# Patient Record
Sex: Male | Born: 1940 | Hispanic: No | Marital: Married | Smoking: Never smoker
Health system: Southern US, Community
[De-identification: ages and names within clinical notes are randomized; demographics above are authoritative.]

## PROBLEM LIST (undated history)

## (undated) DIAGNOSIS — I2510 Atherosclerotic heart disease of native coronary artery without angina pectoris: Secondary | ICD-10-CM

## (undated) DIAGNOSIS — I10 Essential (primary) hypertension: Secondary | ICD-10-CM

## (undated) DIAGNOSIS — I421 Obstructive hypertrophic cardiomyopathy: Secondary | ICD-10-CM

## (undated) DIAGNOSIS — R7303 Prediabetes: Secondary | ICD-10-CM

---

## 1968-03-02 HISTORY — PX: OTHER SURGICAL HISTORY: SHX169

## 1998-12-13 ENCOUNTER — Ambulatory Visit (HOSPITAL_COMMUNITY): Admission: RE | Admit: 1998-12-13 | Discharge: 1998-12-13 | Payer: Self-pay | Admitting: Cardiology

## 1999-04-04 ENCOUNTER — Ambulatory Visit: Admission: RE | Admit: 1999-04-04 | Discharge: 1999-04-04 | Payer: Self-pay | Admitting: Pulmonary Disease

## 2011-03-06 ENCOUNTER — Encounter: Payer: Self-pay | Admitting: Cardiology

## 2011-03-10 ENCOUNTER — Encounter: Payer: Self-pay | Admitting: Cardiology

## 2011-07-28 ENCOUNTER — Other Ambulatory Visit: Payer: Self-pay

## 2011-07-28 DIAGNOSIS — I340 Nonrheumatic mitral (valve) insufficiency: Secondary | ICD-10-CM

## 2011-07-29 ENCOUNTER — Ambulatory Visit (INDEPENDENT_AMBULATORY_CARE_PROVIDER_SITE_OTHER): Payer: Medicare Other | Admitting: Cardiology

## 2011-07-29 ENCOUNTER — Encounter: Payer: Self-pay | Admitting: Cardiology

## 2011-07-29 ENCOUNTER — Other Ambulatory Visit: Payer: Self-pay

## 2011-07-29 ENCOUNTER — Ambulatory Visit (HOSPITAL_COMMUNITY): Payer: Medicare Other | Attending: Cardiology

## 2011-07-29 VITALS — BP 188/98 | HR 68 | Wt 174.0 lb

## 2011-07-29 DIAGNOSIS — I2510 Atherosclerotic heart disease of native coronary artery without angina pectoris: Secondary | ICD-10-CM | POA: Insufficient documentation

## 2011-07-29 DIAGNOSIS — I519 Heart disease, unspecified: Secondary | ICD-10-CM | POA: Insufficient documentation

## 2011-07-29 DIAGNOSIS — I10 Essential (primary) hypertension: Secondary | ICD-10-CM | POA: Insufficient documentation

## 2011-07-29 DIAGNOSIS — E7800 Pure hypercholesterolemia, unspecified: Secondary | ICD-10-CM

## 2011-07-29 DIAGNOSIS — R0989 Other specified symptoms and signs involving the circulatory and respiratory systems: Secondary | ICD-10-CM

## 2011-07-29 DIAGNOSIS — E785 Hyperlipidemia, unspecified: Secondary | ICD-10-CM | POA: Insufficient documentation

## 2011-07-29 DIAGNOSIS — R0609 Other forms of dyspnea: Secondary | ICD-10-CM

## 2011-07-29 DIAGNOSIS — I340 Nonrheumatic mitral (valve) insufficiency: Secondary | ICD-10-CM

## 2011-07-29 DIAGNOSIS — R7309 Other abnormal glucose: Secondary | ICD-10-CM

## 2011-07-29 DIAGNOSIS — I059 Rheumatic mitral valve disease, unspecified: Secondary | ICD-10-CM | POA: Insufficient documentation

## 2011-07-29 DIAGNOSIS — R7302 Impaired glucose tolerance (oral): Secondary | ICD-10-CM

## 2011-07-29 NOTE — Patient Instructions (Signed)
Your physician has requested that you have an exercise stress myoview. For further information please visit [email or web address] Please follow instruction sheet, as given.  Your physician recommends that you continue on your current medications as directed. Please refer to the Current Medication list given to you today.  Your physician has requested that you regularly monitor and record your blood pressure readings at home. Please use the same machine at the same time of day to check your readings and record them to bring to your follow-up visit.

## 2011-07-29 NOTE — Progress Notes (Signed)
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  Overall, he is now retired and doing quite well.  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 Shirlee Hess.  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  He has not had chest pain, and plays regular golf during which he walks quite a bit.  He has since I saw him acquired some mild glucose intolerance.  He also has some hypertension.  He altered his statin which he tolerated poorly, and took it again after starting Vit D.  He has mild symptoms only on pravachol.  His prior cath report is in deep archive and the report is being pulled.  Dr. Sherlyn Hess notes, that while his BP is elevated here, it normally runs around 125/ 75-85 while at home.  He is able to use the elliptical at home without symptoms.    Current Outpatient Prescriptions  Medication Sig Dispense Refill  . aspirin 162 MG EC tablet Take 162 mg by mouth daily.      Marland Kitchen Co-Enzyme Q-10 100 MG CAPS Take 100 mg by mouth 2 (two) times daily.      . metoprolol succinate (TOPROL-XL) 50 MG 24 hr tablet Take 50 mg by mouth daily. Take with or immediately following a meal.      . Multiple Vitamin (MULTIVITAMIN) tablet Take 1 tablet by mouth every other day.      . Omega-3 Fatty Acids (FISH OIL) 1000 MG CAPS Take 1,000 mg by mouth 2 (two) times daily.      . pravastatin (PRAVACHOL) 80 MG tablet Take 80 mg by mouth daily.      . Vitamin D, Ergocalciferol, (DRISDOL) 50000 UNITS CAPS Take 50,000 Units by mouth. Three times per week        Allergies  Allergen Reactions  . Ace Inhibitors Cough    No past medical history on file.  No past surgical history on file.  No family history on file.  History   Social History  . Marital Status: Married    Spouse Name: N/A    Number of Children:  N/A  . Years of Education: N/A   Occupational History  . Not on file.   Social History Main Topics  . Smoking status: Never Smoker   . Smokeless tobacco: Not on file  . Alcohol Use: Not on file  . Drug Use: Not on file  . Sexually Active: Not on file   Other Topics Concern  . Not on file   Social History Narrative  . No narrative on file    ROS: Please see the HPI.  All other systems reviewed and negative.  PHYSICAL EXAM:  BP 188/98  Pulse 68  Wt 174 lb (78.926 kg)  General: Well developed, well nourished, in no acute distress. Head:  Normocephalic and atraumatic. Neck: no JVD.  No carotid bruits.   Lungs: Clear to auscultation and percussion. Heart: Normal S1 and S2.  No murmur, rubs or gallops supine.  Left lateral--minimal apical murmur.  Standing---minimal SEM.Marland Kitchen No DM.   Abdomen:  Normal bowel sounds; soft; non tender; no organomegaly Pulses: Pulses normal in all 4 extremities. Extremities: No clubbing or cyanosis. No edema. Neurologic: Alert and oriented x 3.  EKG:  NSR.  WNL.  ECHO  Study Conclusions  - Left ventricle: The cavity size was normal. There was sigmoid-type mild septal hypertrophy. Turbulent flow across the LV outflow tract with peak measured gradient 23 mmHg. Systolic function was normal. The estimated ejection fraction was in the range of 60% to 65%. Wall motion was normal; there were no regional wall motion abnormalities. Features are consistent with a pseudonormal left ventricular filling pattern, with concomitant abnormal relaxation and increased filling pressure (grade 2 diastolic dysfunction). - Aortic valve: There was no stenosis. - Mitral valve: There was chordal SAM but no evident valvular SAM (images a bit difficult). Mild regurgitation. - Left atrium: The atrium was mildly dilated. - Right ventricle: The cavity size was normal. Systolic function was normal. - Pulmonary arteries: No complete TR doppler jet so unable to  estimate PA systolic pressure. - Inferior vena cava: The vessel was normal in size; the respirophasic diameter changes were in the normal range (= 50%); findings are consistent with normal central venous pressure. Impressions:  - Normal LV size with sigmoid-type septal hypertrophy. LV outflow tractturbulence with peak gradient reaching 23 mmHg. Normal LV systolic function, EF 60-65%. Moderate diastolic dysfunction. 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 Mild MR. Normal RV size and systolic function.    ASSESSMENT AND PLAN:

## 2011-07-30 NOTE — Assessment & Plan Note (Signed)
Most recent LDL was 81.  HDL 46.  Tolerating pravastatin while on Vit D replacement.

## 2011-07-30 NOTE — Assessment & Plan Note (Signed)
He says that fasting glucoses are 110.  Recent A1c 6.1

## 2011-07-30 NOTE — Assessment & Plan Note (Signed)
He says well controlled at home.  Had higher HR on ARB, did not tolerate ACEs in the past due to cough.  Currently on beta blocker.

## 2011-08-12 ENCOUNTER — Ambulatory Visit (HOSPITAL_COMMUNITY): Payer: Medicare Other | Attending: Cardiology | Admitting: Radiology

## 2011-08-12 ENCOUNTER — Encounter: Payer: Self-pay | Admitting: Cardiology

## 2011-08-12 VITALS — BP 141/83 | HR 68 | Ht 69.5 in | Wt 174.0 lb

## 2011-08-12 DIAGNOSIS — R0609 Other forms of dyspnea: Secondary | ICD-10-CM | POA: Insufficient documentation

## 2011-08-12 DIAGNOSIS — R0602 Shortness of breath: Secondary | ICD-10-CM

## 2011-08-12 DIAGNOSIS — R0989 Other specified symptoms and signs involving the circulatory and respiratory systems: Secondary | ICD-10-CM | POA: Insufficient documentation

## 2011-08-12 MED ORDER — TECHNETIUM TC 99M TETROFOSMIN IV KIT
11.0000 | PACK | Freq: Once | INTRAVENOUS | Status: AC | PRN
  Administered 2011-08-12: 11 via INTRAVENOUS

## 2011-08-12 MED ORDER — TECHNETIUM TC 99M TETROFOSMIN IV KIT
33.0000 | PACK | Freq: Once | INTRAVENOUS | Status: AC | PRN
  Administered 2011-08-12: 33 via INTRAVENOUS

## 2011-08-12 NOTE — Progress Notes (Signed)
[facility] SITE 3 NUCLEAR MED [address] [place] [address] [phone]  Cardiology Nuclear Med Study  Jacob Hess is a 71 y.o. male     MRN : [id number]     DOB: 09-19-1940  Procedure Date: 08/12/2011  Nuclear Med Background Indication for Stress Test:  Evaluation for Ischemia History:  '10 GXT:(+)>Cath:N/O CAD>MPS:No ischemia (all info. per patient) Cardiac Risk Factors: Family History - CAD, Hypertension, Lipids and Glucose Intolerance  Symptoms:  DOE and Palpitations   Nuclear Pre-Procedure Caffeine/Decaff Intake:  7:30am NPO After: 7:30am   Lungs:  Clear. O2 Sat: 99% on room air. IV 0.9% NS with Angio Cath:  20g  IV Site: R Antecubital  IV Started by:  Bonnita Levan, RN  Chest Size (in):  42 Cup Size: n/a  Height: 5' 9.5" (1.765 m)  Weight:  174 lb (78.926 kg)  BMI:  Body mass index is 25.33 kg/(m^2). Tech Comments:  Toprol held >24 hours    Nuclear Med Study 1 or 2 day study: 1 day  Stress Test Type:  Stress  Reading MD: Charlton Haws, MD  Order Authorizing Provider:  Shawnie Pons, MD  Resting Radionuclide: Technetium 39m Tetrofosmin  Resting Radionuclide Dose: 11.0 mCi   Stress Radionuclide:  Technetium 32m Tetrofosmin  Stress Radionuclide Dose: 33.0 mCi           Stress Protocol Rest HR: 68 Stress HR: 160  Rest BP: 141/83 Stress BP: 184/74  Exercise Time (min): 11:00 METS: 13.4   Predicted Max HR: 152 bpm % Max HR: 105.26 bpm Rate Pressure Product: 40102   Dose of Adenosine (mg):  n/a Dose of Lexiscan: n/a mg  Dose of Atropine (mg): n/a Dose of Dobutamine: n/a mcg/kg/min (at max HR)  Stress Test Technologist: Smiley Houseman, CMA-N  Nuclear Technologist:  Domenic Polite, CNMT     Rest Procedure:  Myocardial perfusion imaging was performed at rest 45 minutes following the intravenous administration of Technetium 49m Tetrofosmin.  Rest ECG: No acute changes  Stress Procedure:  The patient exercised on the treadmill utilizing the  Bruce protocol for eleven minutes.  He then stopped due to fatigue and denied any chest pain.  There were marked ST-T wave changes, that quickly resolved in recovery; occasional PVC's and PAC's were noted.  Technetium 40m Tetrofosmin was injected at peak exercise and myocardial perfusion imaging was performed after a brief delay.  Stress ECG: 1.5 mm ST segment depression in lateral leads  QPS Raw Data Images:  Normal; no motion artifact; normal heart/lung ratio. Stress Images:  Normal homogeneous uptake in all areas of the myocardium. Rest Images:  Normal homogeneous uptake in all areas of the myocardium. Subtraction (SDS):  Normal Transient Ischemic Dilatation (Normal <1.22):  0.84 Lung/Heart Ratio (Normal <0.45):  0.28  Quantitative Gated Spect Images QGS EDV:  61 ml QGS ESV:  16 ml  Impression Exercise Capacity:  Excellent exercise capacity. BP Response:  Normal blood pressure response. Clinical Symptoms:  There is dyspnea. ECG Impression:  1.5 mm ST segment depression in lateral leads Comparison with Prior Nuclear Study: No images to compare  Overall Impression:  Low risk study.  Normal nuclear images. Positive ECG.  Patient has apparantly had a positive ECG in past followed by normal cath in 2010  LV Ejection Fraction: 74%.  LV Wall Motion:  NL LV Function; NL Wall Motion     Charlton Haws

## 2011-08-19 ENCOUNTER — Telehealth: Payer: Self-pay

## 2011-08-19 MED ORDER — IRBESARTAN 150 MG PO TABS: 150.0000 mg | ORAL_TABLET | Freq: Every day | ORAL | Status: DC

## 2011-08-19 NOTE — Telephone Encounter (Signed)
Per communication from the pt and Dr Riley Kill the pt's medications will be changed.  The pt will start Irbesartan 150mg  every morning and decrease Metoprolol ER to 25mg  at bedtime. Rx sent to pharmacy.

## 2013-02-03 ENCOUNTER — Ambulatory Visit (INDEPENDENT_AMBULATORY_CARE_PROVIDER_SITE_OTHER): Payer: Medicare Other | Admitting: Cardiovascular Disease

## 2013-02-03 VITALS — BP 165/88 | HR 63 | Ht 69.5 in | Wt 172.8 lb

## 2013-02-03 DIAGNOSIS — I10 Essential (primary) hypertension: Secondary | ICD-10-CM

## 2013-02-03 DIAGNOSIS — I2510 Atherosclerotic heart disease of native coronary artery without angina pectoris: Secondary | ICD-10-CM

## 2013-02-03 DIAGNOSIS — E7800 Pure hypercholesterolemia, unspecified: Secondary | ICD-10-CM

## 2013-02-03 NOTE — Patient Instructions (Signed)
Your physician has requested that you have an exercise tolerance test with Dr Excell Seltzer. For further information please visit [email or web address] Please also follow instruction sheet, as given.  Your physician has requested that you have an abdominal aorta duplex. During this test, an ultrasound is used to evaluate the aorta. Allow 30 minutes for this exam. Do not eat after midnight the day before and avoid carbonated beverages  Your physician has requested that you have an echocardiogram. Echocardiography is a painless test that uses sound waves to create images of your heart. It provides your doctor with information about the size and shape of your heart and how well your heart's chambers and valves are working. This procedure takes approximately one hour. There are no restrictions for this procedure.  Your physician recommends that you return for a FASTING LIPID and LIVER profile--nothing to eat or drink after midnight  Your physician recommends that you continue on your current medications as directed. Please refer to the Current Medication list given to you today.

## 2013-02-03 NOTE — Progress Notes (Signed)
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 He has been seen by Dr Riley Kill in the past. He is followed for hypertension, hyperlipidemia, CAD, and mild mitral regurgitation. 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 His last echocardiogram was May 2013. He underwent cardiac catheterization in 2000 by Dr Riley Kill and this demonstrated nonobstructive CAD.  He remains physically active with regular exercise. He experiences exertional dyspnea with moderate level exercise (Class 2) without much progression over the past year. He denies exertional chest pain or pressure. He denies edema, palpitations, orthopnea, PND, or syncope.   He tried an ARB because of HTN and prediabetes but was unable to tolerate because of lightheadedness with exercise. He notes a consistent BP drop during exercise, but he has not been lightheaded since stopping the ARB.  Outpatient Encounter Prescriptions as of 02/03/2013  Medication Sig  . aspirin 162 MG EC tablet Take 162 mg by mouth daily.  . metoprolol succinate (TOPROL-XL) 25 MG 24 hr tablet Take 25 mg by mouth at bedtime. Take with or immediately following a meal.  25-50 Mg depending on BP  . Omega-3 Fatty Acids (FISH OIL) 1000 MG CAPS Take 1,000 mg by mouth daily.   . pravastatin (PRAVACHOL) 80 MG tablet Take 80 mg by mouth daily.  . Vitamin D, Ergocalciferol, (DRISDOL) 50000 UNITS CAPS Take 50,000 Units by mouth. Three times per week  . [DISCONTINUED] Co-Enzyme Q-10 100 MG CAPS Take 100 mg by mouth 2 (two) times daily.  . [DISCONTINUED] irbesartan (AVAPRO) 150 MG tablet Take 1 tablet (150 mg total) by mouth daily.  . [DISCONTINUED] Multiple Vitamin (MULTIVITAMIN) tablet Take 1 tablet by mouth every other day.    Allergies  Allergen Reactions  . Ace Inhibitors Cough    No past medical history on file.  ROS: Negative except as per HPI  BP 165/88  Pulse 63  Ht 5'  9.5" (1.765 m)  Wt 172 lb 12.8 oz (78.382 kg)  BMI 25.16 kg/m2  PHYSICAL EXAM: Pt is alert and oriented, NAD HEENT: normal Neck: JVP - normal, carotids 2+= without bruits Lungs: CTA bilaterally CV: RRR without murmur or gallop, even with positional maneuvers Abd: soft, NT, Positive BS, no hepatomegaly Ext: no C/C/E, distal pulses intact and equal Skin: warm/dry no rash  EKG:  Normal sinus rhythm 64 beats per minute, within normal limits  2-D echocardiogram: Left ventricle: The cavity size was normal. There was sigmoid-type mild septal hypertrophy. Turbulent flow across the LV outflow tract with peak measured gradient 23 mmHg. Systolic function was normal. The estimated ejection fraction was in the range of 60% to 65%. Wall motion was normal; there were no regional wall motion abnormalities. Features are consistent with a pseudonormal left ventricular filling pattern, with concomitant abnormal relaxation and increased filling pressure (grade 2 diastolic dysfunction).  ------------------------------------------------------------ Aortic valve: Trileaflet. Doppler: There was no stenosis. No regurgitation.  ------------------------------------------------------------ Aorta: Aortic root: The aortic root was normal in size. Ascending aorta: The ascending aorta was normal in size.  ------------------------------------------------------------ Mitral valve: There was chordal SAM but no evident valvular SAM (images a bit difficult). Doppler: There was no evidence for stenosis. Mild regurgitation. Peak gradient: 3mm Hg (D).  ------------------------------------------------------------ Left atrium: The atrium was mildly dilated.  ------------------------------------------------------------ Right ventricle: The cavity size was normal. Systolic function was normal.  ------------------------------------------------------------ Pulmonic valve: Structurally normal valve.  Cusp separation was normal. Doppler: Transvalvular velocity  was within the normal range. Trivial regurgitation.  ------------------------------------------------------------ Tricuspid valve: Doppler: Trivial regurgitation.  ------------------------------------------------------------ Pulmonary artery: No complete TR doppler jet so unable to estimate PA systolic pressure.  ------------------------------------------------------------ Right atrium: The atrium was normal in size.  ------------------------------------------------------------ Pericardium: There was no pericardial effusion.  ------------------------------------------------------------ Systemic veins: Inferior vena cava: The vessel was normal in size; the respirophasic diameter changes were in the normal range (= 50%); findings are consistent with normal central venous Pressure.  Myoview scan 08/12/2011: QPS  Raw Data Images: Normal; no motion artifact; normal heart/lung ratio.  Stress Images: Normal homogeneous uptake in all areas of the myocardium.  Rest Images: Normal homogeneous uptake in all areas of the myocardium.  Subtraction (SDS): Normal  Transient Ischemic Dilatation (Normal <1.22): 0.84  Lung/Heart Ratio (Normal <0.45): 0.28  Quantitative Gated Spect Images  QGS EDV: 61 ml  QGS ESV: 16 ml  Impression  Exercise Capacity: Excellent exercise capacity.  BP Response: Normal blood pressure response.  Clinical Symptoms: There is dyspnea.  ECG Impression: 1.5 mm ST segment depression in lateral leads  Comparison with Prior Nuclear Study: No images to compare  Overall Impression: Low risk study. Normal nuclear images. Positive ECG. Patient has apparantly had a positive ECG in past followed by normal cath in 2010  LV Ejection Fraction: 74%. LV Wall Motion: NL LV Function; NL Wall Motion   ASSESSMENT AND PLAN: 1. CAD, native vessel. Dyspnea with exertion noted. He had nonobstructive disease at cath 15 years ago.  Will check an exercise EKG stress test. Continue current med Rx.  2. LV outflow obstruction - may be responsible for exercise-induced symptoms. Will evaluate with a repeat 2D echo and exercise treadmill.  3. AAA screen - 72 year-old male with known CAD. Check abdominal aortic ultrasound.  4. Hyperlipidemia - tolerating a statin drug. Repeat lipids when he returns for other testing. Previous lipid studies reviewed.  Tonny Bollman 02/04/2013 6:55 AM

## 2013-02-04 ENCOUNTER — Encounter: Payer: Self-pay | Admitting: Cardiovascular Disease

## 2013-02-04 DIAGNOSIS — I2510 Atherosclerotic heart disease of native coronary artery without angina pectoris: Secondary | ICD-10-CM | POA: Insufficient documentation

## 2013-03-17 ENCOUNTER — Other Ambulatory Visit: Payer: Medicare Other

## 2013-03-17 ENCOUNTER — Encounter: Payer: Medicare Other | Admitting: Physician Assistant

## 2013-03-17 ENCOUNTER — Other Ambulatory Visit (HOSPITAL_COMMUNITY): Payer: Medicare Other

## 2013-03-17 ENCOUNTER — Encounter (HOSPITAL_COMMUNITY): Payer: Medicare Other

## 2013-03-20 ENCOUNTER — Telehealth: Payer: Self-pay | Admitting: Cardiovascular Disease

## 2013-03-20 NOTE — Telephone Encounter (Signed)
New message   Patient did not want to disclose any information will disclose when the nurse call back .

## 2013-03-20 NOTE — Telephone Encounter (Signed)
Pt wants to speak with Dr. Excell Seltzerooper directly no dentalis given. Pt  is aware that Dr. Excell Seltzerooper is not in the office today. Pt states will call back  on Wednesday when MD  is in.

## 2013-03-20 NOTE — Telephone Encounter (Signed)
Will forward to Dr Excell Seltzerooper so he will be aware

## 2013-03-31 ENCOUNTER — Other Ambulatory Visit: Payer: Self-pay | Admitting: Nurse Practitioner

## 2013-03-31 ENCOUNTER — Ambulatory Visit (HOSPITAL_BASED_OUTPATIENT_CLINIC_OR_DEPARTMENT_OTHER): Payer: Medicare Other | Admitting: Cardiology

## 2013-03-31 ENCOUNTER — Ambulatory Visit (HOSPITAL_COMMUNITY): Payer: Medicare Other | Attending: Cardiovascular Disease

## 2013-03-31 ENCOUNTER — Other Ambulatory Visit: Payer: Medicare Other

## 2013-03-31 ENCOUNTER — Ambulatory Visit (INDEPENDENT_AMBULATORY_CARE_PROVIDER_SITE_OTHER): Payer: Medicare Other | Admitting: Cardiovascular Disease

## 2013-03-31 DIAGNOSIS — I2510 Atherosclerotic heart disease of native coronary artery without angina pectoris: Secondary | ICD-10-CM | POA: Insufficient documentation

## 2013-03-31 DIAGNOSIS — E7800 Pure hypercholesterolemia, unspecified: Secondary | ICD-10-CM

## 2013-03-31 DIAGNOSIS — R0602 Shortness of breath: Secondary | ICD-10-CM

## 2013-03-31 DIAGNOSIS — E785 Hyperlipidemia, unspecified: Secondary | ICD-10-CM | POA: Insufficient documentation

## 2013-03-31 DIAGNOSIS — I10 Essential (primary) hypertension: Secondary | ICD-10-CM

## 2013-03-31 DIAGNOSIS — R0989 Other specified symptoms and signs involving the circulatory and respiratory systems: Secondary | ICD-10-CM

## 2013-03-31 DIAGNOSIS — I700 Atherosclerosis of aorta: Secondary | ICD-10-CM

## 2013-03-31 DIAGNOSIS — R9439 Abnormal result of other cardiovascular function study: Secondary | ICD-10-CM

## 2013-03-31 DIAGNOSIS — R0609 Other forms of dyspnea: Secondary | ICD-10-CM

## 2013-03-31 NOTE — Progress Notes (Signed)
Exercise Treadmill Test  Pre-Exercise Testing Evaluation Rhythm: normal sinus  Rate: 86     Test  Exercise Tolerance Test Ordering MD: Tonny BollmanMichael Cooper, MD  Interpreting MD: Tonny BollmanMichael Cooper, MD  Unique Test No: 1  Treadmill:  1  Indication for ETT: DOE  Contraindication to ETT: No   Stress Modality: exercise - treadmill  Cardiac Imaging Performed: non   Protocol: standard Bruce - maximal  Max BP:  162/67  Max MPHR (bpm):  150 85% MPR (bpm): 128  MPHR obtained (bpm):  157 % MPHR obtained:  105  Reached 85% MPHR (min:sec):  3:52 Total Exercise Time (min-sec):  10:00  Workload in METS:  11.7 Borg Scale: 13  Reason ETT Terminated:  dyspnea    ST Segment Analysis At Rest: normal ST segments - no evidence of significant ST depression With Exercise: significant ischemic ST depression  Other Information Arrhythmia:  No Angina during ETT:  absent (0) Quality of ETT:  diagnostic  ETT Interpretation:  abnormal - evidence of ST depression consistent with ischemia  Comments: 1. Good exercise tolerance 2. Clinically positive EKG with 2.5 mm flat ST depression at peak exercise associated with drop in blood pressure 3. No angina with exertion  Recommendations: Gated Cardiac CTA to eval for obstructive CAD.

## 2013-03-31 NOTE — Progress Notes (Signed)
Echo performed. 

## 2013-04-04 ENCOUNTER — Other Ambulatory Visit: Payer: Self-pay | Admitting: *Deleted

## 2013-04-04 ENCOUNTER — Encounter: Payer: Self-pay | Admitting: Cardiovascular Disease

## 2013-04-04 DIAGNOSIS — R9439 Abnormal result of other cardiovascular function study: Secondary | ICD-10-CM

## 2013-04-04 DIAGNOSIS — R0602 Shortness of breath: Secondary | ICD-10-CM

## 2013-04-04 NOTE — Progress Notes (Signed)
Pt will need bmet for ct angio/ order placed.

## 2013-04-14 ENCOUNTER — Ambulatory Visit (HOSPITAL_COMMUNITY)
Admission: RE | Admit: 2013-04-14 | Discharge: 2013-04-14 | Disposition: A | Discharge status: Home / Self Care | Payer: Medicare Other | Source: Ambulatory Visit | Attending: Cardiovascular Disease | Admitting: Cardiovascular Disease

## 2013-04-14 ENCOUNTER — Encounter (HOSPITAL_COMMUNITY): Payer: Self-pay

## 2013-04-14 DIAGNOSIS — R0602 Shortness of breath: Secondary | ICD-10-CM

## 2013-04-14 DIAGNOSIS — I2510 Atherosclerotic heart disease of native coronary artery without angina pectoris: Secondary | ICD-10-CM | POA: Insufficient documentation

## 2013-04-14 DIAGNOSIS — R9439 Abnormal result of other cardiovascular function study: Secondary | ICD-10-CM

## 2013-04-14 DIAGNOSIS — R079 Chest pain, unspecified: Secondary | ICD-10-CM

## 2013-04-14 HISTORY — DX: Essential (primary) hypertension: I10

## 2013-04-14 MED ORDER — METOPROLOL TARTRATE 1 MG/ML IV SOLN
INTRAVENOUS | Status: AC
  Filled 2013-04-14: qty 5

## 2013-04-14 MED ORDER — METOPROLOL TARTRATE 1 MG/ML IV SOLN
INTRAVENOUS | Status: AC
  Administered 2013-04-14: 14:00:00 5 mg via INTRAVENOUS
  Filled 2013-04-14: qty 5

## 2013-04-14 MED ORDER — IOHEXOL 350 MG/ML SOLN
80.0000 mL | Freq: Once | INTRAVENOUS | Status: AC | PRN
  Administered 2013-04-14: 80 mL via INTRAVENOUS

## 2013-04-14 MED ORDER — METOPROLOL TARTRATE 1 MG/ML IV SOLN
5.0000 mg | INTRAVENOUS | Status: DC | PRN
  Administered 2013-04-14: 5 mg via INTRAVENOUS
  Filled 2013-04-14: qty 5

## 2013-04-14 MED ORDER — NITROGLYCERIN 0.4 MG SL SUBL
SUBLINGUAL_TABLET | SUBLINGUAL | Status: DC
Start: 2013-04-14 — End: 2013-04-15
  Filled 2013-04-14: qty 25

## 2013-04-19 ENCOUNTER — Telehealth: Payer: Self-pay | Admitting: Nurse Practitioner

## 2013-04-19 DIAGNOSIS — I421 Obstructive hypertrophic cardiomyopathy: Secondary | ICD-10-CM

## 2013-04-19 NOTE — Telephone Encounter (Signed)
Order for Cardiac MRI in epic and [facility] Dr. Shirlee LatchMcLean is available to read.

## 2013-04-19 NOTE — Telephone Encounter (Signed)
Message copied by Levi AlandSWINYER, MICHELLE M on Wed Apr 19, 2013 11:51 AM ------      Message from: Tonny BollmanOOPER, MICHAEL      Created: Fri Apr 14, 2013  3:07 PM       Reviewed study with Dr Shirlee LatchMcLean. Discussed with patient. Some findings indicative of hypertrophic cardiomyopathy. Recommend cardiac MRI to evaluate for hypertrophic cardiomyopathy. ------

## 2013-04-28 ENCOUNTER — Encounter: Payer: Self-pay | Admitting: Cardiovascular Disease

## 2013-05-04 ENCOUNTER — Ambulatory Visit (HOSPITAL_COMMUNITY)
Admission: RE | Admit: 2013-05-04 | Discharge: 2013-05-04 | Disposition: A | Discharge status: Home / Self Care | Payer: Medicare Other | Source: Ambulatory Visit | Attending: Cardiovascular Disease | Admitting: Cardiovascular Disease

## 2013-05-04 DIAGNOSIS — I421 Obstructive hypertrophic cardiomyopathy: Secondary | ICD-10-CM

## 2013-05-04 DIAGNOSIS — I517 Cardiomegaly: Secondary | ICD-10-CM | POA: Insufficient documentation

## 2013-05-04 LAB — CREATININE, SERUM
CREATININE: 1.15 mg/dL (ref 0.50–1.35)
GFR calc Af Amer: 73 mL/min — ABNORMAL LOW (ref >90)
GFR, EST NON AFRICAN AMERICAN: 63 mL/min — AB (ref >90)

## 2013-05-04 MED ORDER — GADOBENATE DIMEGLUMINE 529 MG/ML IV SOLN
27.0000 mL | Freq: Once | INTRAVENOUS | Status: AC | PRN
  Administered 2013-05-04: 27 mL via INTRAVENOUS

## 2015-01-04 ENCOUNTER — Encounter: Payer: Self-pay | Admitting: Cardiovascular Disease

## 2015-01-04 ENCOUNTER — Ambulatory Visit (INDEPENDENT_AMBULATORY_CARE_PROVIDER_SITE_OTHER): Payer: Medicare Other | Admitting: Cardiovascular Disease

## 2015-01-04 VITALS — BP 137/70 | HR 59 | Ht 69.5 in | Wt 170.0 lb

## 2015-01-04 DIAGNOSIS — I422 Other hypertrophic cardiomyopathy: Secondary | ICD-10-CM

## 2015-01-04 NOTE — Progress Notes (Signed)
Cardiology Office Note Date:  01/06/2015   ID:  Jacob Hess, DOB Oct 26, 1940, MRN [id number]  PCP:  Feliciana RossettiGRISSO,GREG, MD  Cardiologist:  Tonny Bollmanooper, Ira Dougher, MD    Chief Complaint  Patient presents with  . Follow-up   History of Present Illness: Jacob Hess is a 74 y.o. male who presents for follow-up evaluation. Since last seen, Dr Sherlyn Lickhatt is doing well. He is followed for hypertension, hyperlipidemia, CAD, and mild mitral regurgitation. 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  He remains active with regular exercise and follows a Mediterranean Diet. He notes BP in the morning is usually in the 140-150's, but drops to 100 mmHg or less following exercise. He is generally asymptomatic with this. No chest pain, shortness of breath, or syncope. Rare palpitations. No edema or other complaints.  Past Medical History  Diagnosis Date  . Hypertension     History reviewed. No pertinent past surgical history.  Current Outpatient Prescriptions  Medication Sig Dispense Refill  . aspirin EC 81 MG tablet Take 81 mg by mouth daily.    . colesevelam (WELCHOL) 625 MG tablet Take 625 mg by mouth 3 (three) times daily with meals.    . FLULAVAL QUADRIVALENT injection See admin instructions.  0  . irbesartan (AVAPRO) 75 MG tablet Take 75 mg by mouth daily.  3  . metoprolol succinate (TOPROL-XL) 100 MG 24 hr tablet TAKE 1.5 TABLETS BY MOUTH DAILY  3  . Omega-3 Fatty Acids (OMEGA-3 FISH OIL) 1200 MG CAPS Take 1 capsule by mouth 2 (two) times daily.    . pravastatin (PRAVACHOL) 80 MG tablet Take 80 mg by mouth daily.    . Vitamin D, Ergocalciferol, (DRISDOL) 50000 UNITS CAPS Take 50,000 Units by mouth. Three times per week     No current facility-administered medications for this visit.    Allergies:   Ace inhibitors   Social History:  The patient  reports that he has never smoked. He does not have any smokeless  tobacco history on file.   Family History:  The patient's  family history includes Heart attack in his brother; Hypertension in his brother and sister; Stroke in his maternal grandmother.    ROS:  Please see the history of present illness.  Otherwise, review of systems is positive for palpitations.  All other systems are reviewed and negative.    PHYSICAL EXAM: VS:  BP 137/70 mmHg  Pulse 59  Ht 5' 9.5" (1.765 m)  Wt 170 lb (77.111 kg)  BMI 24.75 kg/m2 , BMI Body mass index is 24.75 kg/(m^2). GEN: Well nourished, well developed, in no acute distress HEENT: normal Neck: no JVD, no masses. No carotid bruits Cardiac: RRR with soft systolic murmur at the LSB              Respiratory:  clear to auscultation bilaterally, normal work of breathing GI: soft, nontender, nondistended, + BS MS: no deformity or atrophy Ext: no pretibial edema, pedal pulses 2+= bilaterally Skin: warm and dry, no rash Neuro:  Strength and sensation are intact Psych: euthymic mood, full affect  EKG:  EKG is ordered today. The ekg ordered today shows sinus rhythm 59 bpm, within normal limits  Recent Labs: No results found for requested labs within last 365 days.   Lipid Panel  No results found for: CHOL, TRIG, HDL, CHOLHDL, VLDL, LDLCALC, LDLDIRECT    Wt Readings from Last 3 Encounters:  01/04/15 170 lb (77.111 kg)  02/03/13 172 lb 12.8 oz (78.382 kg)  08/12/11 174 lb (78.926 kg)     Cardiac Studies Reviewed: 2D Echo 03/31/2013: Left ventricle: LV systolic function is vigorours with near cavity obliteration. There is systolic anterior motion of the mitral leaflet. Peak gradient through the LVOT/AV at rest is 32 mm Hg respectively. The cavity size was normal. Wall thickness was increased in a pattern of moderate LVH. Systolic function was vigorous. The estimated ejection fraction was in the range of 65% to 70%. Doppler parameters are consistent with abnormal left ventricular relaxation (grade 1  diastolic dysfunction).  ------------------------------------------------------------ Aortic valve:  Structurally normal valve.  Cusp separation was normal. Doppler: Transvalvular velocity was within the normal range. There was no stenosis. No regurgitation.  ------------------------------------------------------------ Aorta: The aorta was normal, not dilated, and non-diseased.  ------------------------------------------------------------ Mitral valve:  Structurally normal valve.  Leaflet separation was normal. Doppler: Transvalvular velocity was within the normal range. There was no evidence for stenosis. Trivial regurgitation.  ------------------------------------------------------------ Left atrium: The atrium was normal in size.  ------------------------------------------------------------ Right ventricle: The cavity size was normal. Wall thickness was normal. Systolic function was normal.  ------------------------------------------------------------ Pulmonic valve:  Structurally normal valve.  Cusp separation was normal. Doppler: Transvalvular velocity was within the normal range. No regurgitation.  ------------------------------------------------------------ Tricuspid valve:  Structurally normal valve.  Leaflet separation was normal. Doppler: Transvalvular velocity was within the normal range. No regurgitation.  ------------------------------------------------------------ Right atrium: The atrium was normal in size.  ------------------------------------------------------------ Pericardium: There was no pericardial effusion.  ------------------------------------------------------------ Systemic veins: Inferior vena cava: The vessel was normal in size; the respirophasic diameter changes were in the normal range (= 50%); findings are consistent with normal central  venous pressure.  ------------------------------------------------------------ Post procedure conclusions Ascending Aorta:  - The aorta was normal, not dilated, and non-diseased.  ------------------------------------------------------------  2D measurements    Normal Doppler measurements  Normal Left ventricle         Left ventricle LVID ED, 39.88 mm   43-52  Ea, lat   8.58 cm/s ------ chord,             ann, tiss PLAX              DP LVID ES,  23.1 mm   23-38  E/Ea, lat  7.65    ------ chord,             ann, tiss PLAX              DP FS,     42 %   >29   Ea, med   4.58 cm/s ------ chord,             ann, tiss PLAX              DP LVPW, ED 10.09 mm   ------ E/Ea, med 14.32    ------ IVS/LVPW  1.65    <1.3  ann, tiss ratio, ED           DP Ventricular septum       LVOT IVS, ED  16.67 mm   ------ Peak vel,  216 cm/s ------ LVOT              S Diam, S   21 mm   ------ VTI, S   41.1 cm  ------ Area    3.46 cm^2  ------ Peak     19 mm Hg ------ Diam     21 mm   ------ gradient,  Aorta             S Root     35 mm   ------ Stroke vol 142.4 ml  ------ diam, ED            Stroke   73.4 ml/m^ ------ Left atrium          index      2 AP dim    33 mm   ------ Mitral valve AP dim   1.7 cm/m^2 <2.2  Peak E vel 65.6 cm/s ------ index             Peak A vel  78 cm/s ------                Decelerati  310 ms  150-23                on time        0                Peak E/A   0.8    ------                ratio  Cardiac CT: FINDINGS: Non-cardiac: See separate report from [facility]. No significant findings on limited lung and  soft tissue windows.  Calcium Score: 322 Agatston units  Coronary Arteries: Right dominant with no anomalies  LM: No plaque or stenosis.  LAD system: Calcified plaque noted in the proximal LAD with mild stenosis. There was a small D1 and a moderate D2 without significant disease.  Circumflex system: Calcified plaque in the proximal LCx with minimal stenosis. There was a small ramus, a small OM1, and a moderate PLOM without significant disease.  RCA: There was calcified plaque in the proximal and in the mid RCA with minimal stenosis.  IMPRESSION: 1. Nonobstructive coronary disease as described above.  2. Coronary artery calcium score 322 Agatston units, placing the patient in the 61st percentile for his age and gender. This suggests intermediate risk of future cardiac events.  Cardiac MRI: MEASUREMENTS: Septal wall: 15 mm  Posterior wall: 7 mm  LV EDV: 99 mL  LV SV 69 mL  LV EF 70%  FINDINGS: Limited views of the lung fields showed no gross abnormalities. Normal left ventricular size with vigorous systolic function, EF 70%. No wall motion abnormalities. There was mild asymmetric basal septal hypertrophy. There was mitral valve systolic anterior motion with turbulent flow across the LV outflow tract. Normal right ventricular size and systolic function. Normal atrial sizes. The aortic valve was trileaflet with no stenosis or regurgitation. As noted, there was systolic anterior motion of the mitral valve with no more than mild mitral regurgitation (though flow sequences to quantify were not done).  First pass perfusion images appeared normal. There was no myocardial delayed enhancement.  IMPRESSION: 1. Normal LV size with vigorous systolic function, EF 70%.  2. Mild asymmetric basal septal hypertrophy with turbulence across the LV outflow tract suggesting elevated gradient (echo needed to quantify gradient). There was mild valve systolic  anterior motion with no more than mild MR.  3. Normal RV size and systolic function.  4. No myocardial delayed enhancement, so no definitive evidence for prior myocardial infarction, infiltrative disease, or myocarditis.  5. 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 There is no delayed enhancement. It is possible that he could have HOCM physiology in the setting of significant hypertension with basal septal hypertrophy (in the absence of genetically mediated HOCM).  ASSESSMENT AND  PLAN: 1.  CAD, mild, nonobstructive. Cardiac CTA from last year reviewed. Continue risk reduction measures. LDL 59 on outside labs.   2. Hyperlipidemia: on combination of Welchol, pravastatin, Omega-3. Can't tolerate high-intensity statins.  3. 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 Septum 1.5-1.65 cm depending on which imaging modality (echo/ CMR). Will refer to Dr Regino Schultze for further considerations such as genetic testing. Overall appears stable without high-risk features. Will repeat an echo and exercise treadmill.  Current medicines are reviewed with the patient today.  The patient does not have concerns regarding medicines.  Labs/ tests ordered today include:   Orders Placed This Encounter  Procedures  . Exercise Tolerance Test  . EKG 12-Lead  . Echocardiogram    Disposition:   FU one year  Signed, Tonny Bollman, MD  01/06/2015 10:19 PM    [facility] [address] [place], [place], [place]  [address] Phone: [phone]; Fax: [phone]

## 2015-01-04 NOTE — Patient Instructions (Signed)
Medication Instructions:  Your physician recommends that you continue on your current medications as directed. Please refer to the Current Medication list given to you today.  Labwork: No new orders.   Testing/Procedures: Your physician has requested that you have an exercise tolerance test with Dr Excell Seltzerooper. For further information please visit [email or web address] Please also follow instruction sheet, as given.  Your physician has requested that you have an echocardiogram. Echocardiography is a painless test that uses sound waves to create images of your heart. It provides your doctor with information about the size and shape of your heart and how well your heart's chambers and valves are working. This procedure takes approximately one hour. There are no restrictions for this procedure.  Follow-Up: You have been referred to Dr Nolon RodAndrew Wang at [facility].    Any Other Special Instructions Will Be Listed Below (If Applicable).     If you need a refill on your cardiac medications before your next appointment, please call your pharmacy.

## 2015-01-06 ENCOUNTER — Encounter: Payer: Self-pay | Admitting: Cardiovascular Disease

## 2015-01-15 ENCOUNTER — Other Ambulatory Visit: Payer: Self-pay

## 2015-01-15 ENCOUNTER — Ambulatory Visit (INDEPENDENT_AMBULATORY_CARE_PROVIDER_SITE_OTHER): Payer: Medicare Other

## 2015-01-15 ENCOUNTER — Encounter: Payer: Medicare Other | Admitting: Cardiovascular Disease

## 2015-01-15 ENCOUNTER — Other Ambulatory Visit (HOSPITAL_COMMUNITY): Payer: Medicare Other

## 2015-01-15 ENCOUNTER — Ambulatory Visit (HOSPITAL_COMMUNITY): Payer: Medicare Other | Attending: Cardiovascular Disease

## 2015-01-15 DIAGNOSIS — I10 Essential (primary) hypertension: Secondary | ICD-10-CM | POA: Insufficient documentation

## 2015-01-15 DIAGNOSIS — I5189 Other ill-defined heart diseases: Secondary | ICD-10-CM | POA: Diagnosis not present

## 2015-01-15 DIAGNOSIS — I517 Cardiomegaly: Secondary | ICD-10-CM | POA: Insufficient documentation

## 2015-01-15 DIAGNOSIS — I340 Nonrheumatic mitral (valve) insufficiency: Secondary | ICD-10-CM | POA: Diagnosis not present

## 2015-01-15 DIAGNOSIS — E785 Hyperlipidemia, unspecified: Secondary | ICD-10-CM | POA: Insufficient documentation

## 2015-01-15 DIAGNOSIS — I422 Other hypertrophic cardiomyopathy: Secondary | ICD-10-CM

## 2015-01-15 DIAGNOSIS — I421 Obstructive hypertrophic cardiomyopathy: Secondary | ICD-10-CM | POA: Insufficient documentation

## 2015-01-15 DIAGNOSIS — I351 Nonrheumatic aortic (valve) insufficiency: Secondary | ICD-10-CM | POA: Insufficient documentation

## 2015-01-15 LAB — EXERCISE TOLERANCE TEST
CSEPPHR: 127 {beats}/min
Estimated workload: 11.7 METS
Exercise duration (min): 10 min
Exercise duration (sec): 0 s
MPHR: 148 {beats}/min
Percent HR: 86 %
RPE: 15
Rest HR: 57 {beats}/min

## 2015-01-15 NOTE — Patient Instructions (Signed)
Your physician wants you to follow-up in: 1 YEAR with Dr Cooper.  You will receive a reminder letter in the mail two months in advance. If you don't receive a letter, please call our office to schedule the follow-up appointment.  

## 2015-01-17 ENCOUNTER — Encounter: Payer: Medicare Other | Admitting: Physician Assistant

## 2015-01-17 ENCOUNTER — Other Ambulatory Visit (HOSPITAL_COMMUNITY): Payer: Medicare Other

## 2015-01-23 ENCOUNTER — Encounter: Payer: Self-pay | Admitting: Physician Assistant

## 2016-01-10 ENCOUNTER — Encounter: Payer: Self-pay | Admitting: Cardiovascular Disease

## 2016-01-10 ENCOUNTER — Ambulatory Visit (INDEPENDENT_AMBULATORY_CARE_PROVIDER_SITE_OTHER): Payer: Medicare Other | Admitting: Cardiovascular Disease

## 2016-01-10 VITALS — BP 124/72 | HR 64 | Ht 69.5 in | Wt 177.0 lb

## 2016-01-10 DIAGNOSIS — I421 Obstructive hypertrophic cardiomyopathy: Secondary | ICD-10-CM | POA: Diagnosis not present

## 2016-01-10 DIAGNOSIS — R002 Palpitations: Secondary | ICD-10-CM | POA: Diagnosis not present

## 2016-01-10 DIAGNOSIS — I2510 Atherosclerotic heart disease of native coronary artery without angina pectoris: Secondary | ICD-10-CM

## 2016-01-10 NOTE — Patient Instructions (Signed)
Medication Instructions:  Your physician recommends that you continue on your current medications as directed. Please refer to the Current Medication list given to you today.   Labwork: None Ordered   Testing/Procedures: Your physician has recommended that you wear a holter monitor. Holter monitors are medical devices that record the heart's electrical activity. Doctors most often use these monitors to diagnose arrhythmias. Arrhythmias are problems with the speed or rhythm of the heartbeat. The monitor is a small, portable device. You can wear one while you do your normal daily activities. This is usually used to diagnose what is causing palpitations/syncope (passing out).  Your physician has requested that you have an exercise tolerance test in 1 year before your office visit. For further information please visit [email or web address] Please also follow instruction sheet, as given.  Your physician has requested that you have an echocardiogram in 1 year before your office visit. Echocardiography is a painless test that uses sound waves to create images of your heart. It provides your doctor with information about the size and shape of your heart and how well your heart's chambers and valves are working. This procedure takes approximately one hour. There are no restrictions for this procedure.    Follow-Up: Your physician wants you to follow-up in: 1 year with Dr. Theodoro Parmaooper You will receive a reminder letter in the mail two months in advance. If you don't receive a letter, please call our office to schedule the follow-up appointment.   If you need a refill on your cardiac medications before your next appointment, please call your pharmacy.   Thank you for choosing [facility]! Eligha BridegroomMichelle Swinyer, RN [phone]

## 2016-01-10 NOTE — Progress Notes (Signed)
Cardiology Office Note Date:  01/10/2016   ID:  Jacob Hess, DOB [birth year], MRN [id number]  PCP:  Feliciana RossettiGRISSO,GREG, MD  Cardiologist:  Tonny Bollmanooper, Sundance Moise, MD    Chief Complaint  Patient presents with  . coronary artherosclerosis     History of Present Illness: Jacob Hess is a 75 y.o. male who presents for follow-up evaluation. He was last seen in November 2016. He's followed for hypertension, hyperlipidemia, nonobstructive coronary artery disease, and mild mitral regurgitation. 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 He has long-standing exercise induced hypotension which is been stable and relatively mild. He's able to maintain an active lifestyle and he exercises on a regular basis.  [facility] by Dr Regino SchultzeWang. He underwent genetic testing which was negative for HCM. 2 of his 3 children have been tested with echo and EKG studies, all reportedly normal.  He has occasional heart palpitations. He continues to have predictable hypotensive episodes with exercise when he reaches a high workload.   He continues to have some side effects from pravastatin with pain in his legs. He is considering a PCSK9 clinical trial.   Past Medical History:  Diagnosis Date  . Hypertension     No past surgical history on file.  Current Outpatient Prescriptions  Medication Sig Dispense Refill  . aspirin EC 81 MG tablet Take 81 mg by mouth daily.    . colesevelam (WELCHOL) 625 MG tablet Take 625 mg by mouth 3 (three) times daily with meals.    . irbesartan (AVAPRO) 75 MG tablet Take 75 mg by mouth daily.  3  . metoprolol succinate (TOPROL-XL) 100 MG 24 hr tablet TAKE 1.5 TABLETS BY MOUTH DAILY  3  . Omega-3 Fatty Acids (OMEGA-3 FISH OIL) 1200 MG CAPS Take 1 capsule by mouth 2 (two) times daily.    . pravastatin (PRAVACHOL) 80 MG tablet Take 80 mg by mouth daily.    . Vitamin D,  Ergocalciferol, (DRISDOL) 50000 UNITS CAPS Take 50,000 Units by mouth. Three times per week     No current facility-administered medications for this visit.     Allergies:   Ace inhibitors; Ramipril; and Statins   Social History:  The patient  reports that he has never smoked. He does not have any smokeless tobacco history on file.   Family History:  The patient's family history includes Heart attack in his brother; Hypertension in his brother and sister; Stroke in his maternal grandmother.    ROS:  Please see the history of present illness.  All other systems are reviewed and negative.    PHYSICAL EXAM: VS:  BP 124/72   Pulse 64   Ht 5' 9.5" (1.765 m)   Wt 80.3 kg (177 lb)   BMI 25.76 kg/m  , BMI Body mass index is 25.76 kg/m. GEN: Well nourished, well developed, in no acute distress  HEENT: normal  Neck: no JVD, no masses. No carotid bruits Cardiac: RRR without murmur or gallop, few premature beats during auscultation.             Respiratory:  clear to auscultation bilaterally, normal work of breathing GI: soft, nontender, nondistended, + BS MS: no deformity or atrophy  Ext: no pretibial edema, pedal pulses 2+= bilaterally Skin: warm and dry, no rash Neuro:  Strength and sensation are intact Psych: euthymic mood, full affect  EKG:  EKG is ordered today. The ekg  ordered today shows normal sinus rhythm, within normal limits  Recent Labs: No results found for requested labs within last 8760 hours.   Lipid Panel  No results found for: CHOL, TRIG, HDL, CHOLHDL, VLDL, LDLCALC, LDLDIRECT    Wt Readings from Last 3 Encounters:  01/10/16 80.3 kg (177 lb)  01/04/15 77.1 kg (170 lb)  02/03/13 78.4 kg (172 lb 12.8 oz)     Cardiac Studies Reviewed: Exercise tolerance test: Study Highlights    Horizontal ST segment depression ST segment depression was noted during stress, and returning to baseline after 1-5 minutes of recovery.   1. No angina or arrhythmia with exercise  into Stage 4 of the Bruce protocol 2. Normal hemodynamic response to exercise 3. Positive stress ECG for ischemia (no change from previous studies) in patient with known mild, nonobstructive CAD   Echo 01-15-2015: Study Conclusions  - Left ventricle: The cavity size was normal. Wall thickness was   increased in a pattern of mild LVH. There was mild focal basal   hypertrophy of the septum. Systolic function was vigorous. The   estimated ejection fraction was in the range of 65% to 70%. Wall   motion was normal; there were no regional wall motion   abnormalities. Features are consistent with a pseudonormal left   ventricular filling pattern, with concomitant abnormal relaxation   and increased filling pressure (grade 2 diastolic dysfunction). - Aortic valve: There was trivial regurgitation. - Mitral valve: There was mild systolic anterior motion of the   chordal structures. There was mild regurgitation.  Impressions:  - Vigorous LV systolic function; grade 2 diastolic dysfunction;   mild LVH with proximal septal thickening; chordal SAM; no LVOT   gradient; trace AI; mild MR.  Gated cardiac Scan: LM:  No plaque or stenosis.  LAD system: Calcified plaque noted in the proximal LAD with mild stenosis. There was a small D1 and a moderate D2 without significant disease.  Circumflex system: Calcified plaque in the proximal LCx with minimal stenosis. There was a small ramus, a small OM1, and a moderate PLOM without significant disease.  RCA: There was calcified plaque in the proximal and in the mid RCA with minimal stenosis.  IMPRESSION: 1.  Nonobstructive coronary disease as described above.  2. Coronary artery calcium score 322 Agatston units, placing the patient in the 61st percentile for his age and gender. This suggests intermediate risk of future cardiac events  ASSESSMENT AND PLAN: 1.  CAD, nonobstructive: No anginal symptoms. Gated cardiac CTA has been reviewed.  Continue risk reduction measures.  2. Hyperlipidemia: Patient has been treated with multidrug therapy. He cannot tolerate high intensity statin drugs. He is considering a clinical trial.  3. 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 Juanda ChanceWang's evaluation reviewed, genetic testing is negative. The patient has some premature beats on exam. He does have heart palpitations. Will check 24-hour Holter monitor. Recent testing is all reviewed and will plan on a follow-up echocardiogram next year before his return office visit. We'll also do an exercise treadmill study at that time to assess hemodynamic response to exercise.  Current medicines are reviewed with the patient today.  The patient does not have concerns regarding medicines.  Labs/ tests ordered today include:  No orders of the defined types were placed in this encounter.  Disposition:   FU one year with an echo and GXT  Signed, Tonny Bollmanooper, Minda Faas, MD  01/10/2016 9:48 AM    [facility] [address], [place],  [place]  [address] Phone: [phone]; Fax: [phone]

## 2016-01-21 ENCOUNTER — Ambulatory Visit (INDEPENDENT_AMBULATORY_CARE_PROVIDER_SITE_OTHER): Payer: Medicare Other

## 2016-01-21 DIAGNOSIS — R002 Palpitations: Secondary | ICD-10-CM

## 2016-02-03 ENCOUNTER — Encounter: Payer: Self-pay | Admitting: Cardiovascular Disease

## 2016-02-03 NOTE — Telephone Encounter (Signed)
Dr. Sherlyn Lickhatt is calling because he has not received the results of his Holter Monitor done on 01/21/16 . Please call or email results . Thanks

## 2016-02-03 NOTE — Telephone Encounter (Signed)
This encounter was created in error - please disregard.

## 2016-12-29 ENCOUNTER — Telehealth: Payer: Self-pay

## 2016-12-29 NOTE — Telephone Encounter (Signed)
Left message to call back  

## 2016-12-29 NOTE — Telephone Encounter (Signed)
-----   Message from Tonny BollmanMichael Cooper, MD sent at 12/28/2016  9:58 PM EDT ----- [place]: Dr Sherlyn Lickhatt is set up for a GXT and echo next week. Can you get him in for his yearly exam after these studies are done?   thx!

## 2016-12-30 NOTE — Telephone Encounter (Signed)
Scheduled Dr. Sherlyn Lickhatt 11/8 for evaluation. He was grateful for call and agrees with treatment plan.

## 2017-01-04 ENCOUNTER — Other Ambulatory Visit: Payer: Self-pay

## 2017-01-04 ENCOUNTER — Ambulatory Visit (HOSPITAL_COMMUNITY): Payer: Medicare Other | Attending: Cardiovascular Disease

## 2017-01-04 DIAGNOSIS — I10 Essential (primary) hypertension: Secondary | ICD-10-CM | POA: Diagnosis not present

## 2017-01-04 DIAGNOSIS — I421 Obstructive hypertrophic cardiomyopathy: Secondary | ICD-10-CM

## 2017-01-04 DIAGNOSIS — I340 Nonrheumatic mitral (valve) insufficiency: Secondary | ICD-10-CM | POA: Insufficient documentation

## 2017-01-07 ENCOUNTER — Ambulatory Visit: Payer: Medicare Other | Admitting: Cardiovascular Disease

## 2017-01-11 ENCOUNTER — Telehealth: Payer: Self-pay | Admitting: Cardiovascular Disease

## 2017-01-11 NOTE — Telephone Encounter (Signed)
Treadmill was broken for patient's appointment so he cancelled OV with Dr. Excell Seltzerooper. Rescheduled GXT and OV with Dr. Excell Seltzerooper to 02/03/17 same day. Patient agrees with treatment plan.

## 2017-01-11 NOTE — Telephone Encounter (Signed)
New Message     Pt is returning [name] call

## 2017-02-03 ENCOUNTER — Encounter: Payer: Self-pay | Admitting: Cardiovascular Disease

## 2017-02-03 ENCOUNTER — Ambulatory Visit (INDEPENDENT_AMBULATORY_CARE_PROVIDER_SITE_OTHER): Payer: Medicare Other | Admitting: Cardiovascular Disease

## 2017-02-03 ENCOUNTER — Telehealth: Payer: Self-pay | Admitting: Cardiovascular Disease

## 2017-02-03 ENCOUNTER — Ambulatory Visit (INDEPENDENT_AMBULATORY_CARE_PROVIDER_SITE_OTHER): Payer: Medicare Other

## 2017-02-03 VITALS — BP 126/70 | HR 68 | Ht 69.5 in | Wt 176.8 lb

## 2017-02-03 DIAGNOSIS — E7800 Pure hypercholesterolemia, unspecified: Secondary | ICD-10-CM

## 2017-02-03 DIAGNOSIS — I10 Essential (primary) hypertension: Secondary | ICD-10-CM | POA: Diagnosis not present

## 2017-02-03 DIAGNOSIS — I421 Obstructive hypertrophic cardiomyopathy: Secondary | ICD-10-CM

## 2017-02-03 DIAGNOSIS — I2510 Atherosclerotic heart disease of native coronary artery without angina pectoris: Secondary | ICD-10-CM

## 2017-02-03 DIAGNOSIS — I422 Other hypertrophic cardiomyopathy: Secondary | ICD-10-CM | POA: Diagnosis not present

## 2017-02-03 MED ORDER — AMLODIPINE BESYLATE 2.5 MG PO TABS: 2.5000 mg | ORAL_TABLET | Freq: Every day | ORAL | 3 refills | Status: DC

## 2017-02-03 NOTE — Patient Instructions (Signed)
Medication Instructions:  1) START AMLODIPINE 2.5 mg daily  Labwork: None  Testing/Procedures: Dr. Excell Seltzerooper recommends you have a 24 hour BP MONITOR.  Follow-Up: Your provider wants you to follow-up in: 1 year with Dr. Excell Seltzerooper. You will also have an echocardiogram at this time. You will be called to arrange both appointments with Dr. Earmon Phoenixooper's schedule opens next year.    Any Other Special Instructions Will Be Listed Below (If Applicable).     If you need a refill on your cardiac medications before your next appointment, please call your pharmacy.

## 2017-02-03 NOTE — Progress Notes (Signed)
Cardiology Office Note Date:  02/03/2017   ID:  Jacob Hess, DOB 1940/09/19, MRN [id number]  PCP:  Gordan Payment., MD  Cardiologist:  Tonny Bollman, MD    No chief complaint on file.    History of Present Illness: Jacob Hess is a 76 y.o. male who presents for follow-up of nonobstructive coronary artery disease, hypertension, hyperlipidemia, and hypertrophic cardiomyopathy.  He is here alone today.  He is doing quite well.  Continues to exercise regularly without exertional symptoms.  He has long-standing exertional hypotension which remarkably causes no symptoms at all.  He has noted some elevated blood pressure readings in the morning.  He is developed Raynaud's and attributes this to use of a beta-blocker.  He denies chest pain, chest pressure, or shortness of breath.  Previously has undergone evaluation for hypertrophic cardiomyopathy at [facility] by Dr. Regino Schultze.  He was felt to potentially have a normal variant as his genetic testing was negative.  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 He has a long hx of false positive treadmill ECG studies.    Past Medical History:  Diagnosis Date  . Hypertension     History reviewed. No pertinent surgical history.  Current Outpatient Medications  Medication Sig Dispense Refill  . aspirin EC 81 MG tablet Take 81 mg by mouth daily.    Marland Kitchen atorvastatin (LIPITOR) 40 MG tablet Take 40 mg by mouth daily.  3  . colesevelam (WELCHOL) 625 MG tablet Take 625 mg by mouth 3 (three) times daily with meals.    Marland Kitchen losartan (COZAAR) 100 MG tablet Take 100 mg by mouth at bedtime.    . metoprolol succinate (TOPROL-XL) 100 MG 24 hr tablet TAKE 1.5 TABLETS BY MOUTH DAILY  3  . Omega-3 Fatty Acids (OMEGA-3 FISH OIL) 1200 MG CAPS Take 1 capsule by mouth 2 (two) times daily.    . Vitamin D, Ergocalciferol, (DRISDOL) 50000 UNITS CAPS Take 50,000 Units by mouth 2 (two) times a week. Three times per week      No  current facility-administered medications for this visit.     Allergies:   Ace inhibitors; Ramipril; and Statins   Social History:  The patient  reports that  has never smoked. he has never used smokeless tobacco.   Family History:  The patient's  family history includes Heart attack in his brother; Hypertension in his brother and sister; Stroke in his maternal grandmother.    ROS:  Please see the history of present illness.   All other systems are reviewed and negative.    PHYSICAL EXAM: VS:  BP 126/70   Pulse 68   Ht 5' 9.5" (1.765 m)   Wt 176 lb 12.8 oz (80.2 kg)   SpO2 97%   BMI 25.73 kg/m  , BMI Body mass index is 25.73 kg/m. GEN: Well nourished, well developed, in no acute distress  HEENT: normal  Neck: no JVD, no masses. No carotid bruits Cardiac: RRR without murmur or gallop     Respiratory:  clear to auscultation bilaterally, normal work of breathing GI: soft, nontender, nondistended, + BS MS: no deformity or atrophy  Ext: no pretibial edema, pedal pulses 2+= bilaterally Skin: warm and dry, no rash Neuro:  Strength and sensation are intact Psych: euthymic mood, full affect  EKG:  EKG is ordered today. The ekg ordered today shows NSR 68 bpm, within normal limits  Recent Labs: No results found for requested labs within last 8760  hours.   Lipid Panel  No results found for: CHOL, TRIG, HDL, CHOLHDL, VLDL, LDLCALC, LDLDIRECT    Wt Readings from Last 3 Encounters:  02/03/17 176 lb 12.8 oz (80.2 kg)  01/10/16 177 lb (80.3 kg)  01/04/15 170 lb (77.1 kg)     Cardiac Studies Reviewed: Echo 01-04-2017: Left ventricle:  There is no LVOT gradient but there is intermittent chordal SAM. The cavity size was normal. There was severe focal basal and moderate concentric hypertrophy. Systolic function was normal. The estimated ejection fraction was in the range of 60% to 65%. Wall motion was normal; there were no regional wall motion abnormalities. Features are consistent  with a pseudonormal left ventricular filling pattern, with concomitant abnormal relaxation and increased filling pressure (grade 2 diastolic dysfunction).  ------------------------------------------------------------------- Aortic valve:   Trileaflet; normal thickness leaflets. Mobility was not restricted.  Doppler:  Transvalvular velocity was within the normal range. There was no stenosis. There was no regurgitation.   ------------------------------------------------------------------- Aorta:  Aortic root: The aortic root was normal in size.  ------------------------------------------------------------------- Mitral valve:   Structurally normal valve.   Mobility was not restricted.  Doppler:  Transvalvular velocity was within the normal range. There was no evidence for stenosis. There was mild regurgitation.    Peak gradient (D): 3 mm Hg.  ------------------------------------------------------------------- Left atrium:  The atrium was at the upper limits of normal in size.   ------------------------------------------------------------------- Right ventricle:  The cavity size was normal. Wall thickness was normal. Systolic function was normal.  ------------------------------------------------------------------- Pulmonic valve:    Structurally normal valve.   Cusp separation was normal.  Doppler:  Transvalvular velocity was within the normal range. There was no evidence for stenosis. There was no regurgitation.  ------------------------------------------------------------------- Tricuspid valve:   Structurally normal valve.    Doppler: Transvalvular velocity was within the normal range. There was trivial regurgitation.  ------------------------------------------------------------------- Pulmonary artery:   The main pulmonary artery was normal-sized. Systolic pressure was within the normal range.  ------------------------------------------------------------------- Right  atrium:  The atrium was normal in size.  ------------------------------------------------------------------- Pericardium:  There was no pericardial effusion.  ------------------------------------------------------------------- Systemic veins: Inferior vena cava: The vessel was normal in size.  ------------------------------------------------------------------- Measurements   Left ventricle                         Value        Reference  LV ID, ED, PLAX chordal        (L)     39.9  mm     43 - 52  LV ID, ES, PLAX chordal                26.1  mm     23 - 38  LV fx shortening, PLAX chordal         35    %      >=29  LV PW thickness, ED                    13.6  mm     ---------  IVS/LV PW ratio, ED                    1.13         <=1.3  Stroke volume, 2D                      91    ml     ---------  Stroke volume/bsa, 2D  46    ml/m^2 ---------  LV e&', lateral                         8.22  cm/s   ---------  LV E/e&', lateral                       9.9          ---------  LV e&', medial                          7.35  cm/s   ---------  LV E/e&', medial                        11.07        ---------  LV e&', average                         7.79  cm/s   ---------  LV E/e&', average                       10.46        ---------    Ventricular septum                     Value        Reference  IVS thickness, ED                      15.4  mm     ---------    LVOT                                   Value        Reference  LVOT ID, S                             19    mm     ---------  LVOT area                              2.84  cm^2   ---------  LVOT ID                                19    mm     ---------  LVOT peak velocity, S                  142   cm/s   ---------  LVOT mean velocity, S                  100   cm/s   ---------  LVOT VTI, S                            32    cm     ---------  LVOT peak gradient, S                  8     mm Hg  ---------  Stroke volume (SV),  LVOT DP  90.7  ml     ---------  Stroke index (SV/bsa), LVOT DP         45.5  ml/m^2 ---------    Aorta                                  Value        Reference  Aortic root ID, ED                     35    mm     ---------    Left atrium                            Value        Reference  LA ID, A-P, ES                         38    mm     ---------  LA ID/bsa, A-P                         1.9   cm/m^2 <=2.2  LA volume, S                           46    ml     ---------  LA volume/bsa, S                       23.1  ml/m^2 ---------  LA volume, ES, 1-p A4C                 44    ml     ---------  LA volume/bsa, ES, 1-p A4C             22.1  ml/m^2 ---------  LA volume, ES, 1-p A2C                 47    ml     ---------  LA volume/bsa, ES, 1-p A2C             23.6  ml/m^2 ---------    Mitral valve                           Value        Reference  Mitral E-wave peak velocity            81.4  cm/s   ---------  Mitral A-wave peak velocity            65.2  cm/s   ---------  Mitral deceleration time               201   ms     150 - 230  Mitral peak gradient, D                3     mm Hg  ---------  Mitral E/A ratio, peak                 1.2          ---------    Pulmonary arteries                     Value  Reference  PA pressure, S, DP                     19    mm Hg  <=30    Tricuspid valve                        Value        Reference  Tricuspid regurg peak velocity         202   cm/s   ---------  Tricuspid peak RV-RA gradient          16    mm Hg  ---------   ASSESSMENT AND PLAN: 1.  Hypertrophic cardiomyopathy: Patient with history of negative genetic testing.  We reviewed his echo images today and his LV septum measures approximately 15-16 mm with his posterior wall measuring 13 mm.  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  He is clinically stable.  2.  Hypertension: Borderline  control.  Will add amlodipine 2.5 mg.  He will continue on his other agents without change. Check ambulatory BP monitor.   3.  Raynauds: Hopefully amlodipine will help with his symptoms.  Symptoms may be related to beta-blocker effect.  4.  Nonobstructive coronary artery disease: Stress test results are reviewed today.  He has a positive stress ECG but no significant change compared to his previous tracings.  No angina on the treadmill.  Continue medical management.  5.  Hyperlipidemia: Continue statin agent.  Labs are reviewed today.  Current medicines are reviewed with the patient today.  The patient does not have concerns regarding medicines.  Labs/ tests ordered today include:  No orders of the defined types were placed in this encounter.   Disposition:   FU one year  Signed, Tonny Bollman, MD  02/03/2017 11:09 AM    [facility] [address] [place], [place], [place]  [address] Phone: [phone]; Fax: [phone]

## 2017-02-03 NOTE — Telephone Encounter (Signed)
New Message  Pt call requesting to speak with RN to f/u from appt on today. Please call back to discuss

## 2017-02-03 NOTE — Telephone Encounter (Signed)
Patient requests [facility] ([facility] 2.5 mg daily[facility] Rx sent. Patient was grateful for call.

## 2017-02-11 LAB — EXERCISE TOLERANCE TEST
CHL CUP MPHR: 146 {beats}/min
CSEPEDS: 1 s
Estimated workload: 10.1 METS
Exercise duration (min): 8 min
Peak HR: 139 {beats}/min
Percent HR: 95 %
RPE: 16
Rest HR: 60 {beats}/min

## 2017-12-06 ENCOUNTER — Other Ambulatory Visit (HOSPITAL_COMMUNITY): Payer: Medicare Other

## 2017-12-22 ENCOUNTER — Telehealth: Payer: Self-pay

## 2017-12-22 DIAGNOSIS — I422 Other hypertrophic cardiomyopathy: Secondary | ICD-10-CM

## 2017-12-22 NOTE — Telephone Encounter (Signed)
Called to rearrange echo and visit with Dr. Excell Seltzer as patient will be out of town 11/4 as previously scheduled.  Left message to call back.

## 2017-12-27 NOTE — Telephone Encounter (Signed)
New message: ° ° ° ° ° ° °Pt is returning a call °

## 2017-12-27 NOTE — Telephone Encounter (Signed)
Echo and subsequent visit with Dr. Excell Seltzer scheduled 11/15. Patient agrees with treatment plan.

## 2018-01-03 ENCOUNTER — Ambulatory Visit: Payer: Medicare Other | Admitting: Cardiovascular Disease

## 2018-01-03 ENCOUNTER — Other Ambulatory Visit (HOSPITAL_COMMUNITY): Payer: Medicare Other

## 2018-01-14 ENCOUNTER — Other Ambulatory Visit: Payer: Self-pay

## 2018-01-14 ENCOUNTER — Ambulatory Visit (INDEPENDENT_AMBULATORY_CARE_PROVIDER_SITE_OTHER): Payer: Medicare Other | Admitting: Cardiovascular Disease

## 2018-01-14 ENCOUNTER — Encounter: Payer: Self-pay | Admitting: Cardiovascular Disease

## 2018-01-14 ENCOUNTER — Ambulatory Visit (HOSPITAL_COMMUNITY): Payer: Medicare Other | Attending: Cardiovascular Disease

## 2018-01-14 VITALS — BP 128/62 | HR 62 | Ht 69.5 in | Wt 174.2 lb

## 2018-01-14 DIAGNOSIS — I2510 Atherosclerotic heart disease of native coronary artery without angina pectoris: Secondary | ICD-10-CM

## 2018-01-14 DIAGNOSIS — I422 Other hypertrophic cardiomyopathy: Secondary | ICD-10-CM

## 2018-01-14 DIAGNOSIS — E7800 Pure hypercholesterolemia, unspecified: Secondary | ICD-10-CM

## 2018-01-14 DIAGNOSIS — I10 Essential (primary) hypertension: Secondary | ICD-10-CM

## 2018-01-14 LAB — TROPONIN T

## 2018-01-14 NOTE — Progress Notes (Signed)
Cardiology Office Note:    Date:  01/14/2018   ID:  Jacob Hess, DOB [birth year], MRN [id number]  PCP:  Gordan PaymentGrisso, Greg A., MD  Cardiologist:  Tonny BollmanMichael Norita Meigs, MD  Electrophysiologist:  None   Referring MD: Gordan PaymentGrisso, Greg A., MD   Chief Complaint  Patient presents with  . Coronary Artery Disease    History of Present Illness:    Jacob Hess is a 77 y.o. male with a hx of HTN, nonobstructive CAD, and hypertrophic cardiomyopathy, presenting for follow-up evaluation.  He has been noted to have false positive stress ECG tests in the past.  The patient is here alone today.  He is been feeling well.  He continues to have episodes of lightheadedness after exercise and he has documented exercise-induced hypotension for many years.  There is been no change in his symptoms.  He has not had frank syncope.  He denies heart palpitations, chest pain, shortness of breath, lightheadedness, or syncope.  Past Medical History:  Diagnosis Date  . Hypertension     History reviewed. No pertinent surgical history.  Current Medications: Current Meds  Medication Sig  . amLODipine (NORVASC) 2.5 MG tablet Take 1 tablet (2.5 mg total) by mouth daily.  Marland Kitchen. aspirin EC 81 MG tablet Take 81 mg by mouth daily.  Marland Kitchen. atorvastatin (LIPITOR) 40 MG tablet Take 40 mg by mouth daily.  . colesevelam (WELCHOL) 625 MG tablet Take 625 mg by mouth 3 (three) times daily with meals.  Marland Kitchen. losartan (COZAAR) 100 MG tablet Take 100 mg by mouth at bedtime.  . metoprolol succinate (TOPROL-XL) 100 MG 24 hr tablet TAKE 1.5 TABLETS BY MOUTH DAILY  . Omega-3 Fatty Acids (OMEGA-3 FISH OIL) 1200 MG CAPS Take 1 capsule by mouth 2 (two) times daily.  . Vitamin D, Ergocalciferol, (DRISDOL) 50000 UNITS CAPS Take 50,000 Units by mouth 2 (two) times a week. Three times per week      Allergies:   Ace inhibitors; Ramipril; and Statins   Social History   Socioeconomic History  . Marital status: Married    Spouse name: Not on file  . Number of  children: Not on file  . Years of education: Not on file  . Highest education level: Not on file  Occupational History  . Not on file  Social Needs  . Financial resource strain: Not on file  . Food insecurity:    Worry: Not on file    Inability: Not on file  . Transportation needs:    Medical: Not on file    Non-medical: Not on file  Tobacco Use  . Smoking status: Never Smoker  . Smokeless tobacco: Never Used  Substance and Sexual Activity  . Alcohol use: Not on file  . Drug use: Not on file  . Sexual activity: Not on file  Lifestyle  . Physical activity:    Days per week: Not on file    Minutes per session: Not on file  . Stress: Not on file  Relationships  . Social connections:    Talks on phone: Not on file    Gets together: Not on file    Attends religious service: Not on file    Active member of club or organization: Not on file    Attends meetings of clubs or organizations: Not on file    Relationship status: Not on file  Other Topics Concern  . Not on file  Social History Narrative  . Not on file     Family History: The patient's  family history includes Heart attack in his brother; Hypertension in his brother and sister; Stroke in his maternal grandmother.  ROS:   Please see the history of present illness.    All other systems reviewed and are negative.  EKGs/Labs/Other Studies Reviewed:    The following studies were reviewed today: ETT 02-04-17: Study Highlights     Blood pressure demonstrated a normal response to exercise.  Upsloping ST segment depression ST segment depression was noted during stress, beginning at 5 minutes of stress, and returning to baseline after 1-5 minutes of recovery.   Good exercise tolerance No angina with exercise Normal BP response to exercise There is 2-3 mm upsloping/flat ST depression with exercise, resolved within 5 minutes of recovery, with no associated symptoms  Positive/adequate stress ECG   Stress  Measurements   Baseline Vitals  Rest HR 60 bpm    Rest BP 176/90 mmHg    Exercise Time  Exercise duration (min) 8 min    Exercise duration (sec) 1 sec    Peak Stress Vitals  Peak HR 139 bpm    Peak BP 175/66 mmHg    Exercise Data  MPHR 146 bpm    Percent HR 95 %    RPE 16     Estimated workload 10.1 METS       2D echocardiogram: Study Conclusions  - Left ventricle: The cavity size was normal. There was severe   septal hypertrophy with otherwise moderate concentric   hypertrophy. Systolic function was normal. The estimated ejection   fraction was in the range of 60% to 65%. There was dynamic   obstruction, with a peak velocity of 98 cm/sec and a peak   gradient of 4 mm Hg. Wall motion was normal; there were no   regional wall motion abnormalities. Doppler parameters are   consistent with abnormal left ventricular relaxation (grade 1   diastolic dysfunction). Doppler parameters are consistent with   indeterminate ventricular filling pressure. - Aortic valve: Transvalvular velocity was within the normal range.   There was no stenosis. There was mild regurgitation. - Mitral valve: There was systolic anterior motion. Transvalvular   velocity was within the normal range. There was no evidence for   stenosis. There was mild regurgitation. - Right ventricle: The cavity size was normal. Wall thickness was   normal. Systolic function was normal. - Tricuspid valve: There was trivial regurgitation. - Pulmonary arteries: Systolic pressure was within the normal   range. PA peak pressure: 20 mm Hg (S). - Pericardium, extracardiac: A trivial pericardial effusion was   identified.  EKG:  EKG is ordered today.  The ekg ordered today demonstrates normal sinus rhythm 62 bpm, isolated Q wave in aVL, within normal limits, unchanged from previous tracing.  Recent Labs: No results found for requested labs within last 8760 hours.  Recent Lipid Panel No results found for: CHOL,  TRIG, HDL, CHOLHDL, VLDL, LDLCALC, LDLDIRECT  Physical Exam:    VS:  BP 128/62   Pulse 62   Ht 5' 9.5" (1.765 m)   Wt 174 lb 3.2 oz (79 kg)   SpO2 99%   BMI 25.36 kg/m     Wt Readings from Last 3 Encounters:  01/14/18 174 lb 3.2 oz (79 kg)  February 04, 2017 176 lb 12.8 oz (80.2 kg)  01/10/16 177 lb (80.3 kg)     GEN:  Well nourished, well developed in no acute distress HEENT: Normal NECK: No JVD; No carotid bruits LYMPHATICS: No lymphadenopathy CARDIAC: RRR, there is a soft  systolic murmur at the left lower sternal border grade 1/6 RESPIRATORY:  Clear to auscultation without rales, wheezing or rhonchi  ABDOMEN: Soft, non-tender, non-distended MUSCULOSKELETAL:  No edema; No deformity  SKIN: Warm and dry NEUROLOGIC:  Alert and oriented x 3 PSYCHIATRIC:  Normal affect   ASSESSMENT:    1. Hypertrophic cardiomyopathy (HCC)   2. Coronary artery disease involving native coronary artery of native heart without angina pectoris   3. Hypercholesterolemia   4. Essential hypertension      PLAN:    In order of problems listed above:  1. The patient's echo images are personally reviewed.  There are no significant changes present.  He does have systolic anterior motion of the mitral valve.  LV wall thickness measurements have not changed significantly over time.  Continue observation. 2. Continue risk reduction measures.  Patient is treated with aspirin 81 mg daily, a high intensity statin drug, and a beta-blocker.  He is been noted to have mild nonobstructive CAD. 3. Reviewed lipids today.  LDL cholesterol has been at goal, but he is trying to achieve an LDL less than 50 mg/dL.  He has been alternating atorvastatin doses and now has increased his dose to 40 mg of atorvastatin daily. 4. Blood pressure is well controlled on amlodipine, losartan, and metoprolol.  The addition of amlodipine has helped with his raynaud's symptoms.   Medication Adjustments/Labs and Tests Ordered: Current  medicines are reviewed at length with the patient today.  Concerns regarding medicines are outlined above.  Orders Placed This Encounter  Procedures  . Troponin T  . Pro b natriuretic peptide (BNP)  . EXERCISE TOLERANCE TEST (ETT)  . EKG 12-Lead  . ECHOCARDIOGRAM COMPLETE   No orders of the defined types were placed in this encounter.   Patient Instructions  Medication Instructions:  Your provider recommends that you continue on your current medications as directed. Please refer to the Current Medication list given to you today.    Labwork: TODAY: Troponin, BNP  Testing/Procedures: Your provider has requested that you have an echocardiogram in 1 year. Echocardiography is a painless test that uses sound waves to create images of your heart. It provides your doctor with information about the size and shape of your heart and how well your heart's chambers and valves are working. This procedure takes approximately one hour. There are no restrictions for this procedure.  Your provider has requested that you have an exercise tolerance test. For further information please visit [email or web address] Please also follow instruction sheet, as given.   Follow-Up: You will be called to arrange your echocardiogram, stress test, and one year office visit.    Signed, Tonny Bollman, MD  01/14/2018 5:27 PM    [facility]

## 2018-01-14 NOTE — Patient Instructions (Signed)
Medication Instructions:  Your provider recommends that you continue on your current medications as directed. Please refer to the Current Medication list given to you today.    Labwork: TODAY: Troponin, BNP  Testing/Procedures: Your provider has requested that you have an echocardiogram in 1 year. Echocardiography is a painless test that uses sound waves to create images of your heart. It provides your doctor with information about the size and shape of your heart and how well your heart's chambers and valves are working. This procedure takes approximately one hour. There are no restrictions for this procedure.  Your provider has requested that you have an exercise tolerance test. For further information please visit [email or web address] Please also follow instruction sheet, as given.   Follow-Up: You will be called to arrange your echocardiogram, stress test, and one year office visit.

## 2018-01-15 LAB — PRO B NATRIURETIC PEPTIDE: NT-Pro BNP: 108 pg/mL (ref 0–486)

## 2018-12-15 ENCOUNTER — Telehealth: Payer: Self-pay

## 2018-12-15 NOTE — Telephone Encounter (Signed)
Called to arrange 1 year echo, gxt, and OV with Dr. Burt Knack late November 2020.  Left message to call back.

## 2018-12-28 NOTE — Telephone Encounter (Signed)
Follow up    Please return call to patient. Scheduler unable to override November appointment slot

## 2018-12-28 NOTE — Telephone Encounter (Signed)
Scheduled patient for echo and same day visit with Dr. Burt Knack 11/23.  He wishes to speak with Dr. Burt Knack before scheduling stress test since he can not do same day (due to [place] restrictions) and he does not wish to [place] screen for it. He was grateful for assistance.

## 2019-01-23 ENCOUNTER — Ambulatory Visit: Payer: Medicare Other | Admitting: Cardiovascular Disease

## 2019-01-23 ENCOUNTER — Other Ambulatory Visit (HOSPITAL_COMMUNITY): Payer: Medicare Other

## 2019-02-07 ENCOUNTER — Other Ambulatory Visit (HOSPITAL_COMMUNITY): Payer: Medicare Other

## 2019-02-10 ENCOUNTER — Encounter (HOSPITAL_COMMUNITY): Payer: Medicare Other

## 2019-03-17 ENCOUNTER — Ambulatory Visit: Payer: Medicare Other | Admitting: Cardiovascular Disease

## 2019-03-17 ENCOUNTER — Other Ambulatory Visit (HOSPITAL_COMMUNITY): Payer: Medicare Other

## 2019-04-11 ENCOUNTER — Other Ambulatory Visit (HOSPITAL_COMMUNITY): Payer: Medicare Other

## 2019-05-05 ENCOUNTER — Other Ambulatory Visit (HOSPITAL_COMMUNITY)
Admission: RE | Admit: 2019-05-05 | Discharge: 2019-05-05 | Disposition: A | Discharge status: Home / Self Care | Payer: Medicare Other | Source: Ambulatory Visit | Attending: Cardiovascular Disease | Admitting: Cardiovascular Disease

## 2019-05-05 ENCOUNTER — Other Ambulatory Visit (HOSPITAL_COMMUNITY): Payer: Medicare Other

## 2019-05-05 DIAGNOSIS — Z20822 Contact with and (suspected) exposure to covid-19: Secondary | ICD-10-CM | POA: Insufficient documentation

## 2019-05-05 DIAGNOSIS — Z01812 Encounter for preprocedural laboratory examination: Secondary | ICD-10-CM | POA: Insufficient documentation

## 2019-05-05 LAB — SARS CORONAVIRUS 2 (TAT 6-24 HRS): SARS Coronavirus 2: NEGATIVE

## 2019-05-09 ENCOUNTER — Ambulatory Visit (INDEPENDENT_AMBULATORY_CARE_PROVIDER_SITE_OTHER): Payer: Medicare Other

## 2019-05-09 ENCOUNTER — Other Ambulatory Visit: Payer: Self-pay

## 2019-05-09 ENCOUNTER — Ambulatory Visit (HOSPITAL_COMMUNITY): Payer: Medicare Other | Attending: Cardiology

## 2019-05-09 ENCOUNTER — Encounter (HOSPITAL_COMMUNITY): Payer: Medicare Other

## 2019-05-09 DIAGNOSIS — I422 Other hypertrophic cardiomyopathy: Secondary | ICD-10-CM

## 2019-05-09 DIAGNOSIS — I340 Nonrheumatic mitral (valve) insufficiency: Secondary | ICD-10-CM | POA: Diagnosis not present

## 2019-05-09 DIAGNOSIS — I119 Hypertensive heart disease without heart failure: Secondary | ICD-10-CM | POA: Insufficient documentation

## 2019-05-09 DIAGNOSIS — I2510 Atherosclerotic heart disease of native coronary artery without angina pectoris: Secondary | ICD-10-CM | POA: Diagnosis present

## 2019-05-09 LAB — EXERCISE TOLERANCE TEST
Estimated workload: 10.1 METS
Exercise duration (min): 9 min
Exercise duration (sec): 0 s
MPHR: 144 {beats}/min
Peak HR: 113 {beats}/min
Percent HR: 78 %
RPE: 14
Rest HR: 57 {beats}/min

## 2019-05-12 ENCOUNTER — Ambulatory Visit (INDEPENDENT_AMBULATORY_CARE_PROVIDER_SITE_OTHER): Payer: Medicare Other | Admitting: Cardiovascular Disease

## 2019-05-12 ENCOUNTER — Encounter: Payer: Self-pay | Admitting: Cardiovascular Disease

## 2019-05-12 ENCOUNTER — Other Ambulatory Visit: Payer: Self-pay

## 2019-05-12 VITALS — BP 122/64 | HR 59 | Ht 69.5 in | Wt 166.8 lb

## 2019-05-12 DIAGNOSIS — I2510 Atherosclerotic heart disease of native coronary artery without angina pectoris: Secondary | ICD-10-CM | POA: Diagnosis not present

## 2019-05-12 DIAGNOSIS — I422 Other hypertrophic cardiomyopathy: Secondary | ICD-10-CM | POA: Diagnosis not present

## 2019-05-12 DIAGNOSIS — E7800 Pure hypercholesterolemia, unspecified: Secondary | ICD-10-CM | POA: Diagnosis not present

## 2019-05-12 DIAGNOSIS — I10 Essential (primary) hypertension: Secondary | ICD-10-CM | POA: Diagnosis not present

## 2019-05-12 NOTE — Progress Notes (Signed)
Cardiology Office Note:    Date:  05/12/2019   ID:  Jacob Hess, DOB 03-30-1940, MRN [id number]  PCP:  Gordan Payment., MD  Cardiologist:  Tonny Bollman, MD  Electrophysiologist:  None   Referring MD: Gordan Payment., MD   Chief Complaint  Patient presents with  . Follow-up    Hypertrophic cardiomyopathy    History of Present Illness:    Jacob Hess is a 79 y.o. 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  Genetic testing for hypertrophic cardiomyopathy has been negative.  He has a long history of false positive stress testing.  Coronary CT angiography has demonstrated nonobstructive coronary disease.  The patient is here alone today.  He is doing well.  He exercises regularly with minimal exertional symptoms.  He specifically denies chest pain or shortness of breath.  He has no orthopnea, PND, or heart palpitations.  He has occasional leg weakness as he moves further along in his exercise.  He has a long history of exercise-induced hypotension and attributes the feeling in his legs to lower blood pressures.  He does not have lightheadedness or presyncope with exertion.  He recently completed an exercise treadmill study and a 2D echocardiogram with the results outlined below.  Past Medical History:  Diagnosis Date  . Hypertension     History reviewed. No pertinent surgical history.  Current Medications: Current Meds  Medication Sig  . amLODipine (NORVASC) 5 MG tablet Take 5 mg by mouth daily.  Marland Kitchen aspirin EC 81 MG tablet Take 81 mg by mouth daily.  Marland Kitchen atorvastatin (LIPITOR) 80 MG tablet Take 80 mg by mouth daily.  Marland Kitchen losartan (COZAAR) 50 MG tablet Take 50 mg by mouth daily.  . metoprolol succinate (TOPROL-XL) 100 MG 24 hr tablet TAKE 1.5 TABLETS BY MOUTH DAILY  . Omega-3 Fatty Acids (OMEGA-3 FISH OIL) 1200 MG CAPS Take 1  capsule by mouth 2 (two) times daily.  . Vitamin D, Ergocalciferol, (DRISDOL) 50000 UNITS CAPS Take 50,000 Units by mouth 2 (two) times a week. Three times per week   . [DISCONTINUED] losartan (COZAAR) 100 MG tablet Take 0.5 tablets by mouth daily.     Allergies:   Ace inhibitors, Ramipril, and Statins   Social History   Socioeconomic History  . Marital status: Married    Spouse name: Not on file  . Number of children: Not on file  . Years of education: Not on file  . Highest education level: Not on file  Occupational History  . Not on file  Tobacco Use  . Smoking status: Never Smoker  . Smokeless tobacco: Never Used  Substance and Sexual Activity  . Alcohol use: Not on file  . Drug use: Not on file  . Sexual activity: Not on file  Other Topics Concern  . Not on file  Social History Narrative  . Not on file   Social Determinants of Health   Financial Resource Strain:   . Difficulty of Paying Living Expenses:   Food Insecurity:   . Worried About Programme researcher, broadcasting/film/video in the Last Year:   . Barista in the Last Year:   Transportation Needs:   . Freight forwarder (Medical):   Marland Kitchen Lack of Transportation (Non-Medical):   Physical Activity:   . Days of Exercise per Week:   . Minutes of Exercise per Session:   Stress:   .  Feeling of Stress :   Social Connections:   . Frequency of Communication with Friends and Family:   . Frequency of Social Gatherings with Friends and Family:   . Attends Religious Services:   . Active Member of Clubs or Organizations:   . Attends Banker Meetings:   Marland Kitchen Marital Status:      Family History: The patient's family history includes Heart attack in his brother; Hypertension in his brother and sister; Stroke in his maternal grandmother.  ROS:   Please see the history of present illness.    All other systems reviewed and are negative.  EKGs/Labs/Other Studies Reviewed:    The following studies were reviewed  today: Echo 05-09-19: IMPRESSIONS    1. Left ventricular ejection fraction, by estimation, is 60 to 65%. The  left ventricle has normal function. The left ventricle has no regional  wall motion abnormalities. There is severe asymmetric left ventricular  hypertrophy of the basal-septal  segment. Left ventricular diastolic parameters were normal.  2. No resting LVOT obstruction  3. Right ventricular systolic function is normal. The right ventricular  size is normal. Tricuspid regurgitation signal is inadequate for assessing  PA pressure.  4. The mitral valve is normal in structure. Mild mitral valve  regurgitation.  5. The aortic valve is tricuspid. Aortic valve regurgitation is trivial.  No aortic stenosis is present.  6. The inferior vena cava is normal in size with greater than 50%  respiratory variability, suggesting right atrial pressure of 3 mmHg.   FINDINGS  Left Ventricle: Left ventricular ejection fraction, by estimation, is 60  to 65%. The left ventricle has normal function. The left ventricle has no  regional wall motion abnormalities. The left ventricular internal cavity  size was normal in size. There is  severe asymmetric left ventricular hypertrophy of the basal-septal  segment. Left ventricular diastolic parameters were normal.   Right Ventricle: The right ventricular size is normal. No increase in  right ventricular wall thickness. Right ventricular systolic function is  normal. Tricuspid regurgitation signal is inadequate for assessing PA  pressure. The tricuspid regurgitant  velocity is 1.64 m/s, and with an assumed right atrial pressure of 3 mmHg,  the estimated right ventricular systolic pressure is 13.8 mmHg.   Left Atrium: Left atrial size was normal in size.   Right Atrium: Right atrial size was normal in size.   Pericardium: There is no evidence of pericardial effusion.   Mitral Valve: The mitral valve is normal in structure. Mild mitral valve   regurgitation.   Tricuspid Valve: The tricuspid valve is normal in structure. Tricuspid  valve regurgitation is trivial.   Aortic Valve: The aortic valve is tricuspid. Aortic valve regurgitation is  trivial. No aortic stenosis is present.   Pulmonic Valve: The pulmonic valve was not well visualized. Pulmonic valve  regurgitation is not visualized.   Aorta: The aortic root and ascending aorta are structurally normal, with  no evidence of dilitation.   Venous: The inferior vena cava is normal in size with greater than 50%  respiratory variability, suggesting right atrial pressure of 3 mmHg.   IAS/Shunts: No atrial level shunt detected by color flow Doppler.     LEFT VENTRICLE  PLAX 2D  LVIDd:     4.10 cm Diastology  LVIDs:     2.70 cm LV e' lateral:  11.10 cm/s  LV PW:     1.15 cm LV E/e' lateral: 7.7  LV IVS:    1.35 cm LV e'  medial:  6.85 cm/s  LVOT diam:   1.80 cm LV E/e' medial: 12.5  LV SV:     68  LV SV Index:  35  LVOT Area:   2.54 cm     RIGHT VENTRICLE  RV Basal diam: 3.20 cm  RV S prime:   11.90 cm/s  TAPSE (M-mode): 2.2 cm   LEFT ATRIUM       Index    RIGHT ATRIUM     Index  LA diam:    3.80 cm 1.94 cm/m RA Area:   9.80 cm  LA Vol (A2C):  33.5 ml 17.11 ml/m RA Volume:  22.90 ml 11.70 ml/m  LA Vol (A4C):  27.8 ml 14.20 ml/m  LA Biplane Vol: 30.9 ml 15.78 ml/m  AORTIC VALVE  LVOT Vmax:  121.00 cm/s  LVOT Vmean: 87.400 cm/s  LVOT VTI:  0.269 m    AORTA  Ao Root diam: 3.30 cm  Ao Asc diam: 3.40 cm   MITRAL VALVE        TRICUSPID VALVE  MV Area (PHT): 3.37 cm  TR Peak grad:  10.8 mmHg  MV Decel Time: 225 msec  TR Vmax:    164.00 cm/s  MV E velocity: 85.90 cm/s  MV A velocity: 71.10 cm/s SHUNTS  MV E/A ratio: 1.21    Systemic VTI: 0.27 m               Systemic Diam: 1.80 cm   GXT 05-09-19: Study Highlights   Blood pressure demonstrated a  normal response to exercise.  Downsloping ST segment depression ST segment depression of 2 mm was noted during stress in the II, III and aVF leads, beginning at 4 minutes of stress, and returning to baseline after 1-5 minutes of recovery.  Abnormal stress test at sub-target heart rates.  Findings similar to stress test 01/2017.  Stress test consistent with ischemia, though patient has a history of false positive stress tests.   Stress Measurements  Baseline Vitals  Rest HR 57 bpm    Rest BP 143/73 mmHg    Exercise Time  Exercise duration (min) 9 min    Exercise duration (sec) 0 sec    Peak Stress Vitals  Peak HR 113 bpm    Peak BP 151/61 mmHg    Exercise Data  MPHR 144 bpm    Percent HR 78 %    RPE 14     Estimated workload 10.1 METS       Holter 01-21-2016: Study Highlights  Study reviewed: The basic rhythm is sinus. There are few PVCs. There are no sustained ventricular or supraventricular arrhythmias. The average heart rate is 66 bpm. No atrial fibrillation or significant bradycardic events.   CT Coronary 04/14/2013: FINDINGS: Non-cardiac: See separate report from [facility]. No significant findings on limited lung and soft tissue windows.  Calcium Score:  322 Agatston units  Coronary Arteries: Right dominant with no anomalies  LM:  No plaque or stenosis.  LAD system: Calcified plaque noted in the proximal LAD with mild stenosis. There was a small D1 and a moderate D2 without significant disease.  Circumflex system: Calcified plaque in the proximal LCx with minimal stenosis. There was a small ramus, a small OM1, and a moderate PLOM without significant disease.  RCA: There was calcified plaque in the proximal and in the mid RCA with minimal stenosis.  IMPRESSION: 1.  Nonobstructive coronary disease as described above.  2. Coronary artery calcium score 322 Agatston units, placing the patient  in the 61st percentile for his age and  gender. This suggests intermediate risk of future cardiac events. EKG:  EKG is ordered today.  The ekg ordered today demonstrates normal sinus rhythm, within normal limits.  Recent Labs: No results found for requested labs within last 8760 hours.  Recent Lipid Panel No results found for: CHOL, TRIG, HDL, CHOLHDL, VLDL, LDLCALC, LDLDIRECT  Physical Exam:    VS:  BP 122/64   Pulse (!) 59   Ht 5' 9.5" (1.765 m)   Wt 166 lb 12.8 oz (75.7 kg)   SpO2 98%   BMI 24.28 kg/m     Wt Readings from Last 3 Encounters:  05/12/19 166 lb 12.8 oz (75.7 kg)  01/14/18 174 lb 3.2 oz (79 kg)  02/03/17 176 lb 12.8 oz (80.2 kg)     GEN:  Well nourished, well developed in no acute distress HEENT: Normal NECK: No JVD; No carotid bruits LYMPHATICS: No lymphadenopathy CARDIAC: RRR, no murmurs, rubs, gallops RESPIRATORY:  Clear to auscultation without rales, wheezing or rhonchi  ABDOMEN: Soft, non-tender, non-distended MUSCULOSKELETAL:  No edema; No deformity  SKIN: Warm and dry NEUROLOGIC:  Alert and oriented x 3 PSYCHIATRIC:  Normal affect   ASSESSMENT:    1. Coronary artery disease involving native coronary artery of native heart without angina pectoris   2. Hypertrophic cardiomyopathy (HCC)   3. Essential hypertension   4. Hypercholesterolemia    PLAN:    In order of problems listed above:  1. Stable without symptoms of angina.  Treadmill study reviewed with ST depression noted but less than we have seen in the past.  I suspect this was seen because he remains on his beta-blocker.  He is doing well with current therapy and I did not recommend any changes today.  We will check a stress echocardiogram next year. 2. I personally reviewed the patient's echo images and we looked at his study together.  Septal thickness is between 1.3 and 1.5 cm which is stable from past readings.  I really do not appreciate much outflow obstruction and certainly there is no gradient at rest.  He has minimal  evidence of chordal Sam.  We will continue his beta-blockade at current dose. 3. Blood pressure well controlled on current therapy 4. Lipids are excellent with an LDL cholesterol of 49 mg/dL.  He has doubled his atorvastatin to 80 mg daily and he has discontinued WelChol because of cost.  Medication Adjustments/Labs and Tests Ordered: Current medicines are reviewed at length with the patient today.  Concerns regarding medicines are outlined above.  Orders Placed This Encounter  Procedures  . EKG 12-Lead  . ECHOCARDIOGRAM STRESS TEST   No orders of the defined types were placed in this encounter.   Patient Instructions  Medication Instructions:  Your provider recommends that you continue on your current medications as directed. Please refer to the Current Medication list given to you today.   *If you need a refill on your cardiac medications before your next appointment, please call your pharmacy*   Follow-Up: You will be called to arrange your 1 year stress echo and office visit with Dr. Excell Seltzer.   Signed, Tonny Bollman, MD  05/12/2019 12:19 PM    [facility]

## 2019-05-12 NOTE — Patient Instructions (Signed)
Medication Instructions:  Your provider recommends that you continue on your current medications as directed. Please refer to the Current Medication list given to you today.   *If you need a refill on your cardiac medications before your next appointment, please call your pharmacy*   Follow-Up: You will be called to arrange your 1 year stress echo and office visit with Dr. Excell Seltzer.

## 2020-05-10 ENCOUNTER — Telehealth: Payer: Self-pay

## 2020-05-10 NOTE — Telephone Encounter (Signed)
The patient reports he was called today to reschedule his stress echo. Offered to reschedule his appointment to after his stress echo in April, but he declined. He states he wishes to see Dr. Excell Seltzer as scheduled and will have echo after as he is fine with being called with results. He was grateful for call.

## 2020-05-20 ENCOUNTER — Other Ambulatory Visit (HOSPITAL_COMMUNITY): Payer: Medicare Other

## 2020-05-23 ENCOUNTER — Other Ambulatory Visit (HOSPITAL_COMMUNITY): Payer: Medicare Other

## 2020-05-27 ENCOUNTER — Ambulatory Visit (INDEPENDENT_AMBULATORY_CARE_PROVIDER_SITE_OTHER): Payer: Medicare Other | Admitting: Cardiovascular Disease

## 2020-05-27 ENCOUNTER — Encounter: Payer: Self-pay | Admitting: Cardiovascular Disease

## 2020-05-27 ENCOUNTER — Other Ambulatory Visit: Payer: Self-pay

## 2020-05-27 VITALS — BP 134/76 | HR 57 | Ht 69.0 in | Wt 171.2 lb

## 2020-05-27 DIAGNOSIS — I2510 Atherosclerotic heart disease of native coronary artery without angina pectoris: Secondary | ICD-10-CM | POA: Diagnosis not present

## 2020-05-27 DIAGNOSIS — I422 Other hypertrophic cardiomyopathy: Secondary | ICD-10-CM | POA: Diagnosis not present

## 2020-05-27 DIAGNOSIS — I10 Essential (primary) hypertension: Secondary | ICD-10-CM

## 2020-05-27 DIAGNOSIS — E782 Mixed hyperlipidemia: Secondary | ICD-10-CM

## 2020-05-27 NOTE — Patient Instructions (Signed)
Medication Instructions:  Your provider recommends that you continue on your current medications as directed. Please refer to the Current Medication list given to you today.   *If you need a refill on your cardiac medications before your next appointment, please call your pharmacy*  Testing/Procedures: Your provider has requested that you have an echocardiogram. Echocardiography is a painless test that uses sound waves to create images of your heart. It provides your doctor with information about the size and shape of your heart and how well your heart's chambers and valves are working. This procedure takes approximately one hour. There are no restrictions for this procedure.      Follow-Up: At [facility], you and your health needs are our priority.  As part of our continuing mission to provide you with exceptional heart care, we have created designated Provider Care Teams.  These Care Teams include your primary Cardiologist (physician) and Advanced Practice Providers (APPs -  Physician Assistants and Nurse Practitioners) who all work together to provide you with the care you need, when you need it. Your next appointment:   12 month(s) The format for your next appointment:   In Person Provider:   You may see Michael Cooper, MD or one of the following Advanced Practice Providers on your designated Care Team:    Scott Weaver, PA-C  Vin Bhagat, PA-C   

## 2020-05-27 NOTE — Progress Notes (Signed)
Cardiology Office Note:    Date:  05/27/2020   ID:  Jacob Hess, DOB 12/10/1940, MRN [id number]  PCP:  Gordan Payment., MD   [facility]  Cardiologist:  Tonny Bollman, MD  Advanced Practice Provider:  No care team member to display Electrophysiologist:  None       Referring MD: Gordan Payment., MD   Chief Complaint  Patient presents with  . Follow-up    HTN, hyperlipidemia    History of Present Illness:    Jacob Hess is a 80 y.o. 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  Genetic testing for hypertrophic cardiomyopathy has been negative.  He has a long history of false positive stress testing.  Coronary CT angiography has demonstrated nonobstructive coronary disease.  The patient is here alone today. He reports no significant change in symptoms. He has longstanding exercise-induced hypotension. When he exercises on the treadmill, it is not uncommon for his systolic BP to drop below 100 mmHg. To avoid hypotension, he exercises in 7-8 minute intervals but tries to reach 30 min total. He reports no changes in any of his symptoms. He denies chest pain or pressure. No shortness of breath, edema, orthopnea, or PND.  Past Medical History:  Diagnosis Date  . Hypertension     History reviewed. No pertinent surgical history.  Current Medications: Current Meds  Medication Sig  . amLODipine (NORVASC) 5 MG tablet Take 5 mg by mouth daily.  Marland Kitchen aspirin EC 81 MG tablet Take 81 mg by mouth daily.  Marland Kitchen atorvastatin (LIPITOR) 80 MG tablet Take 80 mg by mouth daily.  Marland Kitchen losartan (COZAAR) 50 MG tablet Take 50 mg by mouth daily.  . metoprolol succinate (TOPROL-XL) 100 MG 24 hr tablet TAKE 1.5 TABLETS BY MOUTH DAILY  . Omega-3 Fatty Acids (OMEGA-3 FISH OIL) 1200 MG CAPS Take 1 capsule by mouth 2 (two) times daily.  .  Vitamin D, Ergocalciferol, (DRISDOL) 50000 UNITS CAPS Take 50,000 Units by mouth 2 (two) times a week. Three times per week     Allergies:   Ace inhibitors and Ramipril   Social History   Socioeconomic History  . Marital status: Married    Spouse name: Not on file  . Number of children: Not on file  . Years of education: Not on file  . Highest education level: Not on file  Occupational History  . Not on file  Tobacco Use  . Smoking status: Never Smoker  . Smokeless tobacco: Never Used  Substance and Sexual Activity  . Alcohol use: Not on file  . Drug use: Not on file  . Sexual activity: Not on file  Other Topics Concern  . Not on file  Social History Narrative  . Not on file   Social Determinants of Health   Financial Resource Strain: Not on file  Food Insecurity: Not on file  Transportation Needs: Not on file  Physical Activity: Not on file  Stress: Not on file  Social Connections: Not on file     Family History: The patient's family history includes Heart attack in his brother; Hypertension in his brother and sister; Stroke in his maternal grandmother.  ROS:   Please see the history of present illness.    All other systems reviewed and are negative.  EKGs/Labs/Other Studies Reviewed:    The following studies were reviewed today: GXT June 03, 2019: Study Highlights  Blood pressure demonstrated a normal response to exercise.  Downsloping ST segment depression ST segment depression of 2 mm was noted during stress in the II, III and aVF leads, beginning at 4 minutes of stress, and returning to baseline after 1-5 minutes of recovery.  Abnormal stress test at sub-target heart rates.  Findings similar to stress test 01/2017.  Stress test consistent with ischemia, though patient has a history of false positive stress tests.  Echo 05/09/19: IMPRESSIONS    1. Left ventricular ejection fraction, by estimation, is 60 to 65%. The  left ventricle has normal function.  The left ventricle has no regional  wall motion abnormalities. There is severe asymmetric left ventricular  hypertrophy of the basal-septal  segment. Left ventricular diastolic parameters were normal.  2. No resting LVOT obstruction  3. Right ventricular systolic function is normal. The right ventricular  size is normal. Tricuspid regurgitation signal is inadequate for assessing  PA pressure.  4. The mitral valve is normal in structure. Mild mitral valve  regurgitation.  5. The aortic valve is tricuspid. Aortic valve regurgitation is trivial.  No aortic stenosis is present.  6. The inferior vena cava is normal in size with greater than 50%  respiratory variability, suggesting right atrial pressure of 3 mmHg.   Cardiac MRI 05/04/2013: IMPRESSION:  1. Normal LV size with vigorous systolic function, EF 70%.   2. Mild asymmetric basal septal hypertrophy with turbulence across  the LV outflow tract suggesting elevated gradient (echo needed to  quantify gradient). There was mild valve systolic anterior motion  with no more than mild MR.   3. Normal RV size and systolic function.   4. No myocardial delayed enhancement, so no definitive evidence for  prior myocardial infarction, infiltrative disease, or myocarditis.   5. 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 There is no delayed enhancement. It is possible that he  could have HOCM physiology in the setting of significant  hypertension with basal septal hypertrophy (in the absence of  genetically mediated HOCM).   EKG:  EKG is ordered today.  The ekg ordered today demonstrates sinus bradycardia 57 bpm, otherwise normal  Recent Labs: No results found for requested labs within last 8760 hours.  Recent Lipid Panel No results found for: CHOL, TRIG, HDL, CHOLHDL, VLDL, LDLCALC, LDLDIRECT   Risk Assessment/Calculations:       Physical Exam:    VS:  BP 134/76   Pulse (!) 57   Ht 5\' 9"   (1.753 m)   Wt 171 lb 3.2 oz (77.7 kg)   SpO2 99%   BMI 25.28 kg/m     Wt Readings from Last 3 Encounters:  05/27/20 171 lb 3.2 oz (77.7 kg)  05/12/19 166 lb 12.8 oz (75.7 kg)  01/14/18 174 lb 3.2 oz (79 kg)     GEN:  Well nourished, well developed in no acute distress HEENT: Normal NECK: No JVD; No carotid bruits LYMPHATICS: No lymphadenopathy CARDIAC: RRR, soft systolic murmur at the LLSB heard with patient leaning forward RESPIRATORY:  Clear to auscultation without rales, wheezing or rhonchi  ABDOMEN: Soft, non-tender, non-distended MUSCULOSKELETAL:  No edema; No deformity  SKIN: Warm and dry NEUROLOGIC:  Alert and oriented x 3 PSYCHIATRIC:  Normal affect   ASSESSMENT:    1. Hypertrophic cardiomyopathy (HCC)   2. Coronary artery disease involving native coronary artery of native heart without angina pectoris   3. Essential hypertension   4. Mixed hyperlipidemia    PLAN:    In  order of problems listed above:  1. Genetic testing noted to be negative.  Cardiac MRI from 2015 reviewed and demonstrated mild asymmetric basal septal hypertrophy without delayed enhancement (no evidence of infarction, infiltrative disease, or myocarditis).  Continue beta-blockade.  Exercise echo for further evaluation.  2D echo to reassess LV wall thickness, dynamic outflow, and presence of any valvular disease. 2. Doing very well on medical therapy with aspirin and a high intensity statin drug.  Continue beta-blockade. 3. Blood pressure is well controlled on current therapy.  There is always a balance of avoiding exercise-induced hypotension and controlling his blood pressure.  Currently taking losartan, amlodipine, and metoprolol succinate. 4. Lipids have been excellent with report of most recent LDL cholesterol 54.  Labs to be done next month.  He will reach out when they are completed and I will review them as possible.   Shared Decision Making/Informed Consent The risks [chest pain, shortness  of breath, cardiac arrhythmias, dizziness, blood pressure fluctuations, myocardial infarction, stroke/transient ischemic attack, and life-threatening complications (estimated to be 1 in 10,000)], benefits (risk stratification, diagnosing coronary artery disease, treatment guidance) and alternatives of a stress or dobutamine stress echocardiogram were discussed in detail with Mr. Mccowen and he agrees to proceed.      Medication Adjustments/Labs and Tests Ordered: Current medicines are reviewed at length with the patient today.  Concerns regarding medicines are outlined above.  Orders Placed This Encounter  Procedures  . EKG 12-Lead  . ECHOCARDIOGRAM COMPLETE   No orders of the defined types were placed in this encounter.   Patient Instructions  Medication Instructions:  Your provider recommends that you continue on your current medications as directed. Please refer to the Current Medication list given to you today.   *If you need a refill on your cardiac medications before your next appointment, please call your pharmacy*  Testing/Procedures: Your provider has requested that you have an echocardiogram. Echocardiography is a painless test that uses sound waves to create images of your heart. It provides your doctor with information about the size and shape of your heart and how well your heart's chambers and valves are working. This procedure takes approximately one hour. There are no restrictions for this procedure.  Follow-Up: At [facility], you and your health needs are our priority.  As part of our continuing mission to provide you with exceptional heart care, we have created designated Provider Care Teams.  These Care Teams include your primary Cardiologist (physician) and Advanced Practice Providers (APPs -  Physician Assistants and Nurse Practitioners) who all work together to provide you with the care you need, when you need it. Your next appointment:   12 month(s) The format for  your next appointment:   In Person Provider:   You may see Tonny Bollman, MD or one of the following Advanced Practice Providers on your designated Care Team:    Tereso Newcomer, PA-C  Chelsea Aus, [place]      Signed, Tonny Bollman, MD  05/27/2020 5:50 PM    [facility]

## 2020-06-18 ENCOUNTER — Other Ambulatory Visit (HOSPITAL_COMMUNITY): Payer: Medicare Other

## 2020-06-18 ENCOUNTER — Other Ambulatory Visit (HOSPITAL_COMMUNITY)
Admission: RE | Admit: 2020-06-18 | Discharge: 2020-06-18 | Disposition: A | Discharge status: Home / Self Care | Payer: Medicare Other | Source: Ambulatory Visit | Attending: Cardiovascular Disease | Admitting: Cardiovascular Disease

## 2020-06-18 DIAGNOSIS — Z01812 Encounter for preprocedural laboratory examination: Secondary | ICD-10-CM | POA: Diagnosis present

## 2020-06-18 DIAGNOSIS — Z20822 Contact with and (suspected) exposure to covid-19: Secondary | ICD-10-CM | POA: Diagnosis not present

## 2020-06-18 LAB — SARS CORONAVIRUS 2 (TAT 6-24 HRS): SARS Coronavirus 2: NEGATIVE

## 2020-06-19 ENCOUNTER — Telehealth (HOSPITAL_COMMUNITY): Payer: Self-pay

## 2020-06-19 NOTE — Telephone Encounter (Signed)
Spoke with the patient, detailed instructions given. He stated that he understood and would be here for his test. Asked to call back with any questions. Jacob Hess EMTP 

## 2020-06-20 ENCOUNTER — Ambulatory Visit (HOSPITAL_BASED_OUTPATIENT_CLINIC_OR_DEPARTMENT_OTHER): Payer: Medicare Other

## 2020-06-20 ENCOUNTER — Other Ambulatory Visit: Payer: Self-pay

## 2020-06-20 ENCOUNTER — Ambulatory Visit (HOSPITAL_COMMUNITY): Payer: Medicare Other | Attending: Cardiology

## 2020-06-20 DIAGNOSIS — I422 Other hypertrophic cardiomyopathy: Secondary | ICD-10-CM | POA: Diagnosis present

## 2020-06-20 DIAGNOSIS — I2510 Atherosclerotic heart disease of native coronary artery without angina pectoris: Secondary | ICD-10-CM

## 2020-06-20 LAB — ECHOCARDIOGRAM COMPLETE
Area-P 1/2: 3.69 cm2
S' Lateral: 2.6 cm

## 2021-01-03 ENCOUNTER — Other Ambulatory Visit: Payer: Self-pay | Admitting: Orthopedic Surgery

## 2021-01-03 DIAGNOSIS — M4316 Spondylolisthesis, lumbar region: Secondary | ICD-10-CM

## 2021-01-07 ENCOUNTER — Ambulatory Visit
Admission: RE | Admit: 2021-01-07 | Discharge: 2021-01-07 | Disposition: A | Discharge status: Home / Self Care | Payer: Medicare Other | Source: Ambulatory Visit | Attending: Orthopedic Surgery | Admitting: Orthopedic Surgery

## 2021-01-07 DIAGNOSIS — M4316 Spondylolisthesis, lumbar region: Secondary | ICD-10-CM

## 2021-01-07 MED ORDER — IOPAMIDOL (ISOVUE-M 200) INJECTION 41%
1.0000 mL | Freq: Once | INTRAMUSCULAR | Status: AC
  Administered 2021-01-07: 1 mL via EPIDURAL

## 2021-01-07 MED ORDER — METHYLPREDNISOLONE ACETATE 40 MG/ML INJ SUSP (RADIOLOG
80.0000 mg | Freq: Once | INTRAMUSCULAR | Status: AC
  Administered 2021-01-07: 80 mg via EPIDURAL

## 2021-01-07 NOTE — Discharge Instructions (Signed)

## 2021-01-30 ENCOUNTER — Other Ambulatory Visit: Payer: Self-pay | Admitting: Neurological Surgery

## 2021-02-06 NOTE — Progress Notes (Signed)
Surgical Instructions    Your procedure is scheduled on Tuesday, December 13th.  Report to [facility] Main Entrance "A" at 5:30 A.M., then check in with the Admitting office.  Call this number if you have problems the morning of surgery:  [phone]   If you have any questions prior to your surgery date call [phone]: Open Monday-Friday 8am-4pm    Remember:  Do not eat after midnight the night before your surgery  You may drink clear liquids until 4:30 AM the morning of your surgery.   Clear liquids allowed are: Water, Non-Citrus Juices (without pulp), Carbonated Beverages, Clear Tea, Black Coffee ONLY (NO MILK, CREAM OR POWDERED CREAMER of any kind), and Gatorade    Take these medicines the morning of surgery with A SIP OF WATER NONE    As of today, STOP taking any Aspirin (unless otherwise instructed by your surgeon) Aleve, Naproxen, Ibuprofen, Motrin, Advil, Goody's, BC's, all herbal medications, fish oil, and all vitamins.   DAY OF SURGERY:         Do not wear jewelry  Do not wear lotions, powders, colognes, or deodorant. Men may shave face and neck. Do not bring valuables to the hospital.             [facility] is not responsible for any belongings or valuables.  Do NOT Smoke (Tobacco/Vaping)  24 hours prior to your procedure  If you use a CPAP at night, you may bring your mask for your overnight stay.   Contacts, glasses, hearing aids, dentures or partials may not be worn into surgery, please bring cases for these belongings   For patients admitted to the hospital, discharge time will be determined by your treatment team.   Patients discharged the day of surgery will not be allowed to drive home, and someone needs to stay with them for 24 hours.  NO VISITORS WILL BE ALLOWED IN PRE-OP WHERE PATIENTS ARE PREPPED FOR SURGERY.  ONLY 1 SUPPORT PERSON MAY BE PRESENT IN THE WAITING ROOM WHILE YOU ARE IN SURGERY.  IF YOU ARE TO BE ADMITTED, ONCE YOU ARE IN YOUR ROOM YOU  WILL BE ALLOWED TWO (2) VISITORS. 1 (ONE) VISITOR MAY STAY OVERNIGHT BUT MUST ARRIVE TO THE ROOM BY 8pm.  Minor children may have two parents present. Special consideration for safety and communication needs will be reviewed on a case by case basis.  Special instructions:    Oral Hygiene is also important to reduce your risk of infection.  Remember - BRUSH YOUR TEETH THE MORNING OF SURGERY WITH YOUR REGULAR TOOTHPASTE   [facility]- Preparing For Surgery  Before surgery, you can play an important role. Because skin is not sterile, your skin needs to be as free of germs as possible. You can reduce the number of germs on your skin by washing with CHG (chlorahexidine gluconate) Soap before surgery.  CHG is an antiseptic cleaner which kills germs and bonds with the skin to continue killing germs even after washing.     Please do not use if you have an allergy to CHG or antibacterial soaps. If your skin becomes reddened/irritated stop using the CHG.  Do not shave (including legs and underarms) for at least 48 hours prior to first CHG shower. It is OK to shave your face.  Please follow these instructions carefully.     Shower the NIGHT BEFORE SURGERY and the MORNING OF SURGERY with CHG Soap.   If you chose to wash your hair, wash your hair  first as usual with your normal shampoo. After you shampoo, rinse your hair and body thoroughly to remove the shampoo.  Then [employer] and genitals (private parts) with your normal soap and rinse thoroughly to remove soap.  After that Use CHG Soap as you would any other liquid soap. You can apply CHG directly to the skin and wash gently with a scrungie or a clean washcloth.   Apply the CHG Soap to your body ONLY FROM THE NECK DOWN.  Do not use on open wounds or open sores. Avoid contact with your eyes, ears, mouth and genitals (private parts). Wash Face and genitals (private parts)  with your normal soap.   Wash thoroughly, paying special attention to the area  where your surgery will be performed.  Thoroughly rinse your body with warm water from the neck down.  DO NOT shower/wash with your normal soap after using and rinsing off the CHG Soap.  Pat yourself dry with a CLEAN TOWEL.  Wear CLEAN PAJAMAS to bed the night before surgery  Place CLEAN SHEETS on your bed the night before your surgery  DO NOT SLEEP WITH PETS.   Day of Surgery:  Take a shower with CHG soap. Wear Clean/Comfortable clothing the morning of surgery Do not apply any deodorants/lotions.   Remember to brush your teeth WITH YOUR REGULAR TOOTHPASTE.   Please read over the following fact sheets that you were given.

## 2021-02-07 ENCOUNTER — Other Ambulatory Visit: Payer: Self-pay

## 2021-02-07 ENCOUNTER — Encounter (HOSPITAL_COMMUNITY): Payer: Self-pay

## 2021-02-07 ENCOUNTER — Other Ambulatory Visit: Payer: Self-pay | Admitting: Neurological Surgery

## 2021-02-07 ENCOUNTER — Encounter (HOSPITAL_COMMUNITY)
Admission: RE | Admit: 2021-02-07 | Discharge: 2021-02-07 | Disposition: A | Discharge status: Home / Self Care | Payer: Medicare Other | Source: Ambulatory Visit | Attending: Neurological Surgery | Admitting: Neurological Surgery

## 2021-02-07 VITALS — BP 133/76 | HR 54 | Temp 98.3°F | Resp 17 | Ht 69.0 in | Wt 171.2 lb

## 2021-02-07 DIAGNOSIS — I2510 Atherosclerotic heart disease of native coronary artery without angina pectoris: Secondary | ICD-10-CM | POA: Insufficient documentation

## 2021-02-07 DIAGNOSIS — Z20822 Contact with and (suspected) exposure to covid-19: Secondary | ICD-10-CM | POA: Insufficient documentation

## 2021-02-07 DIAGNOSIS — Z01812 Encounter for preprocedural laboratory examination: Secondary | ICD-10-CM | POA: Insufficient documentation

## 2021-02-07 DIAGNOSIS — I10 Essential (primary) hypertension: Secondary | ICD-10-CM | POA: Insufficient documentation

## 2021-02-07 DIAGNOSIS — Z01818 Encounter for other preprocedural examination: Secondary | ICD-10-CM

## 2021-02-07 HISTORY — DX: Obstructive hypertrophic cardiomyopathy: I42.1

## 2021-02-07 HISTORY — DX: Atherosclerotic heart disease of native coronary artery without angina pectoris: I25.10

## 2021-02-07 LAB — CBC
HCT: 42.7 % (ref 39.0–52.0)
Hemoglobin: 13.7 g/dL (ref 13.0–17.0)
MCH: 29.1 pg (ref 26.0–34.0)
MCHC: 32.1 g/dL (ref 30.0–36.0)
MCV: 90.9 fL (ref 80.0–100.0)
Platelets: 196 10*3/uL (ref 150–400)
RBC: 4.7 MIL/uL (ref 4.22–5.81)
RDW: 12.7 % (ref 11.5–15.5)
WBC: 7.1 10*3/uL (ref 4.0–10.5)
nRBC: 0 % (ref 0.0–0.2)

## 2021-02-07 LAB — SURGICAL PCR SCREEN
MRSA, PCR: NEGATIVE
Staphylococcus aureus: NEGATIVE

## 2021-02-07 LAB — SARS CORONAVIRUS 2 (TAT 6-24 HRS): SARS Coronavirus 2: NEGATIVE

## 2021-02-07 NOTE — Progress Notes (Signed)
PCP - Feliciana Rossetti Cardiologist - Dr. 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  Patient stated that he texted Dr. Excell Seltzer to let him that he was having surgery.    Chest x-ray - n/a EKG - 05/27/20 Stress Test - 06/20/20 ECHO - 06/20/20  Aspirin Instructions: Patient has already stopped  ERAS Protcol - yes, no drink ordered or given   COVID TEST- 02/07/21   Anesthesia review: yes, heart history  Patient denies shortness of breath, fever, cough and chest pain at PAT appointment   All instructions explained to the patient, with a verbal understanding of the material. Patient agrees to go over the instructions while at home for a better understanding. Patient also instructed to self quarantine after being tested for COVID-19. The opportunity to ask questions was provided.

## 2021-02-10 NOTE — Anesthesia Preprocedure Evaluation (Addendum)
Anesthesia Evaluation  Patient identified by MRN, date of birth, ID band Patient awake    Reviewed: Allergy & Precautions, H&P , NPO status , Patient's Chart, lab work & pertinent test results  Airway Mallampati: III  TM Distance: >3 FB Neck ROM: Full    Dental no notable dental hx. (+) Teeth Intact, Dental Advisory Given   Pulmonary neg pulmonary ROS,    Pulmonary exam normal breath sounds clear to auscultation       Cardiovascular Exercise Tolerance: Good hypertension, Pt. on medications and Pt. on home beta blockers + CAD   Rhythm:Regular Rate:Normal     Neuro/Psych negative neurological ROS  negative psych ROS   GI/Hepatic negative GI ROS, Neg liver ROS,   Endo/Other  negative endocrine ROS  Renal/GU negative Renal ROS  negative genitourinary   Musculoskeletal   Abdominal   Peds  Hematology negative hematology ROS (+)   Anesthesia Other Findings   Reproductive/Obstetrics negative OB ROS                           Anesthesia Physical Anesthesia Plan  ASA: 3  Anesthesia Plan: General   Post-op Pain Management: Tylenol PO (pre-op)   Induction: Intravenous  PONV Risk Score and Plan: 3 and Ondansetron, Dexamethasone and Treatment may vary due to age or medical condition  Airway Management Planned: Oral ETT  Additional Equipment:   Intra-op Plan:   Post-operative Plan: Extubation in OR  Informed Consent: I have reviewed the patients History and Physical, chart, labs and discussed the procedure including the risks, benefits and alternatives for the proposed anesthesia with the patient or authorized representative who has indicated his/her understanding and acceptance.     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  He was last seen by Dr. Excell Seltzer 05/19/2020.  Per note, "Genetic testing noted to be negative.  Cardiac MRI from 2015 reviewed and demonstrated mild asymmetric basal septal hypertrophy without delayed enhancement (no evidence of infarction, infiltrative disease, or myocarditis).  Continue beta-blockade.  Exercise echo for further evaluation.  2D echo to reassess LV wall thickness, dynamic outflow, and presence of any valvular disease."  Patient underwent exercise stress echo 06/20/2020 which showed no echocardiographic evidence of ischemia or significant LVOT obstruction with exercise.  Study was low risk.  Dr. Excell Seltzer commented on the study stating that the result was excellent.  He was advised to follow-up in 1 year.  BMP 01/20/2021 in Care Everywhere reviewed, creatinine mildly elevated at 1.30 (appears near baseline), otherwise unremarkable.  CBC 02/07/2021 reviewed, WNL.  EKG 05/19/2020: Sinus bradycardia.  Rate 57.  Exercise stress echo 06/20/2020: 1. Limited study due to poor acoustic windows.  2. Resting LV systolic function is normal with no wall motion  abnormalities.  3. There is appropriate augementation of LV systolic function with  exercise with no wall motion abnormalities noted.  4. Baseline peak LVOT gradient which rose to with exercise  (suboptimal windows and interrogation during exercise)  5. Mild increase in LVOT gradient with exercise without significant  obstruction.  6. ECG with STD in leads II, III, aVF and V3-V5, however, wall motion  normal on TTE with appropriate augementation of LV systolic function with  exercise.  7.  Overall, no echocardiographic evidence of ischemia or significant LVOT  obstruction with exercise.  8. This is a negative stress echocardiogram for ischemia.  9. This is a low risk study.   TTE 06/20/2020: 1. Left  ventricular ejection fraction, by estimation, is 60 to 65%. The  left ventricle has normal function. The left ventricle has no regional  wall motion abnormalities. There is severe hypertrophy of the basal septal  segment. The rest of the LV  segments demonstrate mild left ventricular hypertrophy. Left ventricular  diastolic parameters were normal.  2. No resting LVOT obstruction (similar to prior echo).  3. Right ventricular systolic function is normal. The right ventricular  size is normal. Tricuspid regurgitation signal is inadequate for assessing  PA pressure.  4. The mitral valve is normal in structure. Mild mitral valve  regurgitation. No evidence of mitral stenosis.  5. The aortic valve is tricuspid. Aortic valve regurgitation is trivial.  No aortic stenosis is present.   Comparison(s): No significant change from prior study.   Cardiac MRI 05/04/2013: IMPRESSION:  1. Normal LV size with vigorous systolic function, EF 70%.   2. Mild asymmetric basal septal hypertrophy with turbulence across  the LV outflow tract suggesting elevated gradient (echo needed to  quantify gradient). There was mild valve systolic anterior motion  with no more than mild MR.   3. Normal RV size and systolic function.   4. No myocardial delayed enhancement, so no definitive evidence for  prior myocardial infarction, infiltrative disease, or myocarditis.   5. 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 There is no delayed enhancement. It is possible that he  could have HOCM physiology in the setting of significant  hypertension with basal septal hypertrophy (in the absence of  genetically mediated HOCM).   Coronary CT 04/14/2013: IMPRESSION: 1. Nonobstructive coronary disease as described above.  2. Coronary artery calcium score 322 Agatston units, placing the patient in the 61st percentile for his age and gender. This suggests intermediate risk of future  cardiac events.  )      Anesthesia Quick Evaluation

## 2021-02-10 NOTE — Progress Notes (Signed)
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  He was last seen by Dr. Excell Seltzer 05/19/2020.  Per note, "Genetic testing noted to be negative.  Cardiac MRI from 2015 reviewed and demonstrated mild asymmetric basal septal hypertrophy without delayed enhancement (no evidence of infarction, infiltrative disease, or myocarditis).  Continue beta-blockade.  Exercise echo for further evaluation.  2D echo to reassess LV wall thickness, dynamic outflow, and presence of any valvular disease."  Patient underwent exercise stress echo 06/20/2020 which showed no echocardiographic evidence of ischemia or significant LVOT obstruction with exercise.  Study was low risk.  Dr. Excell Seltzer commented on the study stating that the result was excellent.  He was advised to follow-up in 1 year.  BMP 01/20/2021 in Care Everywhere reviewed, creatinine mildly elevated at 1.30 (appears near baseline), otherwise unremarkable.  CBC 02/07/2021 reviewed, WNL.  EKG 05/19/2020: Sinus bradycardia.  Rate 57.  Exercise stress echo 06/20/2020:  1. Limited study due to poor acoustic windows.   2. Resting LV systolic function is normal with no wall motion  abnormalities.   3. There is appropriate augementation of LV systolic function with  exercise with no wall motion abnormalities noted.   4. Baseline peak LVOT gradient which rose to with exercise  (suboptimal windows and interrogation during exercise)   5. Mild increase in LVOT gradient with exercise without significant  obstruction.   6. ECG with STD in leads II, III, aVF and V3-V5, however, wall motion  normal on TTE with appropriate augementation of LV systolic function with  exercise.   7. Overall, no echocardiographic evidence of ischemia or significant LVOT  obstruction with exercise.    8. This is a negative stress echocardiogram for ischemia.   9. This is a low risk study.   TTE 06/20/2020:  1. Left ventricular ejection fraction, by estimation, is 60 to 65%. The  left ventricle has normal function. The left ventricle has no regional  wall motion abnormalities. There is severe hypertrophy of the basal septal  segment. The rest of the LV  segments demonstrate mild left ventricular hypertrophy. Left ventricular  diastolic parameters were normal.   2. No resting LVOT obstruction (similar to prior echo).   3. Right ventricular systolic function is normal. The right ventricular  size is normal. Tricuspid regurgitation signal is inadequate for assessing  PA pressure.   4. The mitral valve is normal in structure. Mild mitral valve  regurgitation. No evidence of mitral stenosis.   5. The aortic valve is tricuspid. Aortic valve regurgitation is trivial.  No aortic stenosis is present.   Comparison(s): No significant change from prior study.   Cardiac MRI 05/04/2013: IMPRESSION:  1.  Normal LV size with vigorous systolic function, EF 70%.   2. Mild asymmetric basal septal hypertrophy with turbulence across  the LV outflow tract suggesting elevated gradient (echo needed to  quantify gradient). There was mild valve systolic anterior motion  with no more than mild MR.   3.  Normal RV size and systolic function.   4. No myocardial delayed enhancement, so no definitive evidence for  prior myocardial infarction, infiltrative disease, or myocarditis.   5. 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 There is no delayed enhancement. It is possible that he  could have HOCM physiology in the setting of significant  hypertension with basal septal hypertrophy (  in the absence of  genetically mediated HOCM).   Coronary CT 04/14/2013: IMPRESSION: 1.  Nonobstructive coronary disease as described above.   2. Coronary artery calcium score 322 Agatston  units, placing the patient in the 61st percentile for his age and gender. This suggests intermediate risk of future cardiac events.    Zannie Cove [facility] Short Stay Center/Anesthesiology Phone [phone] 02/10/2021 9:06 AM

## 2021-02-11 ENCOUNTER — Other Ambulatory Visit: Payer: Self-pay

## 2021-02-11 ENCOUNTER — Ambulatory Visit (HOSPITAL_COMMUNITY): Payer: Medicare Other | Admitting: Anesthesiology

## 2021-02-11 ENCOUNTER — Encounter (HOSPITAL_COMMUNITY)
Admission: RE | Disposition: A | Discharge status: Home / Self Care | Payer: Self-pay | Source: Home / Self Care | Attending: Neurological Surgery

## 2021-02-11 ENCOUNTER — Ambulatory Visit (HOSPITAL_COMMUNITY): Payer: Medicare Other

## 2021-02-11 ENCOUNTER — Ambulatory Visit (HOSPITAL_COMMUNITY): Payer: Medicare Other | Admitting: Vascular Surgery

## 2021-02-11 ENCOUNTER — Observation Stay (HOSPITAL_COMMUNITY)
Admission: RE | Admit: 2021-02-11 | Discharge: 2021-02-11 | Disposition: A | Discharge status: Home / Self Care | Payer: Medicare Other | Attending: Neurological Surgery | Admitting: Neurological Surgery

## 2021-02-11 ENCOUNTER — Encounter (HOSPITAL_COMMUNITY): Payer: Self-pay | Admitting: Neurological Surgery

## 2021-02-11 DIAGNOSIS — Z09 Encounter for follow-up examination after completed treatment for conditions other than malignant neoplasm: Secondary | ICD-10-CM

## 2021-02-11 DIAGNOSIS — Z8679 Personal history of other diseases of the circulatory system: Secondary | ICD-10-CM | POA: Insufficient documentation

## 2021-02-11 DIAGNOSIS — R7303 Prediabetes: Secondary | ICD-10-CM | POA: Diagnosis not present

## 2021-02-11 DIAGNOSIS — I10 Essential (primary) hypertension: Secondary | ICD-10-CM | POA: Insufficient documentation

## 2021-02-11 DIAGNOSIS — Z7982 Long term (current) use of aspirin: Secondary | ICD-10-CM | POA: Diagnosis not present

## 2021-02-11 DIAGNOSIS — Z79899 Other long term (current) drug therapy: Secondary | ICD-10-CM | POA: Diagnosis not present

## 2021-02-11 DIAGNOSIS — M4316 Spondylolisthesis, lumbar region: Secondary | ICD-10-CM | POA: Diagnosis present

## 2021-02-11 DIAGNOSIS — M48062 Spinal stenosis, lumbar region with neurogenic claudication: Secondary | ICD-10-CM | POA: Insufficient documentation

## 2021-02-11 HISTORY — PX: LUMBAR LAMINECTOMY/DECOMPRESSION MICRODISCECTOMY: SHX5026

## 2021-02-11 HISTORY — DX: Prediabetes: R73.03

## 2021-02-11 LAB — GLUCOSE, CAPILLARY: Glucose-Capillary: 111 mg/dL — ABNORMAL HIGH (ref 70–99)

## 2021-02-11 SURGERY — LUMBAR LAMINECTOMY/DECOMPRESSION MICRODISCECTOMY 1 LEVEL
Anesthesia: General | Site: Spine Lumbar | Laterality: Bilateral

## 2021-02-11 MED ORDER — ACETAMINOPHEN 500 MG PO TABS
1000.0000 mg | ORAL_TABLET | Freq: Once | ORAL | Status: AC
  Administered 2021-02-11: 1000 mg via ORAL
  Filled 2021-02-11: qty 2

## 2021-02-11 MED ORDER — BUPIVACAINE HCL (PF) 0.5 % IJ SOLN
INTRAMUSCULAR | Status: AC
  Filled 2021-02-11: qty 30

## 2021-02-11 MED ORDER — PROPOFOL 10 MG/ML IV BOLUS
INTRAVENOUS | Status: DC | PRN
  Administered 2021-02-11: 150 mg via INTRAVENOUS

## 2021-02-11 MED ORDER — SODIUM CHLORIDE 0.9% FLUSH: 3.0000 mL | INTRAVENOUS | Status: DC | PRN

## 2021-02-11 MED ORDER — DOCUSATE SODIUM 100 MG PO CAPS: 100.0000 mg | ORAL_CAPSULE | Freq: Two times a day (BID) | ORAL | Status: DC

## 2021-02-11 MED ORDER — ALUM & MAG HYDROXIDE-SIMETH 200-200-20 MG/5ML PO SUSP: 30.0000 mL | Freq: Four times a day (QID) | ORAL | Status: DC | PRN

## 2021-02-11 MED ORDER — LIDOCAINE-EPINEPHRINE 1 %-1:100000 IJ SOLN
INTRAMUSCULAR | Status: DC | PRN
  Administered 2021-02-11: 4 mL

## 2021-02-11 MED ORDER — ONDANSETRON HCL 4 MG PO TABS: 4.0000 mg | ORAL_TABLET | Freq: Four times a day (QID) | ORAL | Status: DC | PRN

## 2021-02-11 MED ORDER — CHLORHEXIDINE GLUCONATE CLOTH 2 % EX PADS: 6.0000 | MEDICATED_PAD | Freq: Once | CUTANEOUS | Status: DC

## 2021-02-11 MED ORDER — METHOCARBAMOL 500 MG PO TABS
500.0000 mg | ORAL_TABLET | Freq: Four times a day (QID) | ORAL | 1 refills | Status: DC | PRN
Start: 2021-02-11 — End: 2023-03-15

## 2021-02-11 MED ORDER — ACETAMINOPHEN 325 MG PO TABS: 650.0000 mg | ORAL_TABLET | ORAL | Status: DC | PRN

## 2021-02-11 MED ORDER — PHENOL 1.4 % MT LIQD: 1.0000 | OROMUCOSAL | Status: DC | PRN

## 2021-02-11 MED ORDER — LIDOCAINE 2% (20 MG/ML) 5 ML SYRINGE
INTRAMUSCULAR | Status: DC | PRN
  Administered 2021-02-11: 100 mg via INTRAVENOUS

## 2021-02-11 MED ORDER — 0.9 % SODIUM CHLORIDE (POUR BTL) OPTIME
TOPICAL | Status: DC | PRN
  Administered 2021-02-11: 1000 mL

## 2021-02-11 MED ORDER — TRAMADOL HCL 50 MG PO TABS: 50.0000 mg | ORAL_TABLET | Freq: Four times a day (QID) | ORAL | 0 refills | Status: AC | PRN

## 2021-02-11 MED ORDER — FENTANYL CITRATE (PF) 250 MCG/5ML IJ SOLN
INTRAMUSCULAR | Status: AC
  Filled 2021-02-11: qty 5

## 2021-02-11 MED ORDER — SODIUM CHLORIDE 0.9% FLUSH: 3.0000 mL | Freq: Two times a day (BID) | INTRAVENOUS | Status: DC

## 2021-02-11 MED ORDER — METHOCARBAMOL 500 MG PO TABS: 500.0000 mg | ORAL_TABLET | Freq: Four times a day (QID) | ORAL | Status: DC | PRN

## 2021-02-11 MED ORDER — CEFAZOLIN SODIUM-DEXTROSE 2-4 GM/100ML-% IV SOLN
2.0000 g | INTRAVENOUS | Status: AC
  Administered 2021-02-11: 2 g via INTRAVENOUS
  Filled 2021-02-11: qty 100

## 2021-02-11 MED ORDER — SODIUM CHLORIDE 0.9 % IV SOLN: 250.0000 mL | INTRAVENOUS | Status: DC

## 2021-02-11 MED ORDER — BUPIVACAINE HCL (PF) 0.5 % IJ SOLN
INTRAMUSCULAR | Status: DC | PRN
  Administered 2021-02-11: 4 mL
  Administered 2021-02-11: 25 mL

## 2021-02-11 MED ORDER — MENTHOL 3 MG MT LOZG: 1.0000 | LOZENGE | OROMUCOSAL | Status: DC | PRN

## 2021-02-11 MED ORDER — FLEET ENEMA 7-19 GM/118ML RE ENEM: 1.0000 | ENEMA | Freq: Once | RECTAL | Status: DC | PRN

## 2021-02-11 MED ORDER — SUGAMMADEX SODIUM 200 MG/2ML IV SOLN
INTRAVENOUS | Status: DC | PRN
  Administered 2021-02-11: 154.2 mg via INTRAVENOUS

## 2021-02-11 MED ORDER — HYDROMORPHONE HCL 1 MG/ML IJ SOLN: 0.2500 mg | INTRAMUSCULAR | Status: DC | PRN

## 2021-02-11 MED ORDER — ROCURONIUM BROMIDE 10 MG/ML (PF) SYRINGE
PREFILLED_SYRINGE | INTRAVENOUS | Status: DC | PRN
  Administered 2021-02-11: 50 mg via INTRAVENOUS

## 2021-02-11 MED ORDER — MORPHINE SULFATE (PF) 2 MG/ML IV SOLN: 2.0000 mg | INTRAVENOUS | Status: DC | PRN

## 2021-02-11 MED ORDER — POLYETHYLENE GLYCOL 3350 17 G PO PACK: 17.0000 g | PACK | Freq: Every day | ORAL | Status: DC | PRN

## 2021-02-11 MED ORDER — ONDANSETRON HCL 4 MG/2ML IJ SOLN: 4.0000 mg | Freq: Four times a day (QID) | INTRAMUSCULAR | Status: DC | PRN

## 2021-02-11 MED ORDER — ONDANSETRON HCL 4 MG/2ML IJ SOLN
INTRAMUSCULAR | Status: DC | PRN
  Administered 2021-02-11: 4 mg via INTRAVENOUS

## 2021-02-11 MED ORDER — DEXAMETHASONE SODIUM PHOSPHATE 10 MG/ML IJ SOLN
INTRAMUSCULAR | Status: DC | PRN
  Administered 2021-02-11: 10 mg via INTRAVENOUS

## 2021-02-11 MED ORDER — SENNA 8.6 MG PO TABS: 1.0000 | ORAL_TABLET | Freq: Two times a day (BID) | ORAL | Status: DC

## 2021-02-11 MED ORDER — LACTATED RINGERS IV SOLN: INTRAVENOUS | Status: DC

## 2021-02-11 MED ORDER — METOPROLOL SUCCINATE ER 100 MG PO TB24: 100.0000 mg | ORAL_TABLET | Freq: Every day | ORAL | Status: DC

## 2021-02-11 MED ORDER — LOSARTAN POTASSIUM 50 MG PO TABS: 50.0000 mg | ORAL_TABLET | Freq: Every day | ORAL | Status: DC

## 2021-02-11 MED ORDER — ATORVASTATIN CALCIUM 80 MG PO TABS: 80.0000 mg | ORAL_TABLET | Freq: Every day | ORAL | Status: DC

## 2021-02-11 MED ORDER — ACETAMINOPHEN 650 MG RE SUPP: 650.0000 mg | RECTAL | Status: DC | PRN

## 2021-02-11 MED ORDER — CHLORHEXIDINE GLUCONATE 0.12 % MT SOLN
15.0000 mL | Freq: Once | OROMUCOSAL | Status: AC
  Administered 2021-02-11: 15 mL via OROMUCOSAL
  Filled 2021-02-11: qty 15

## 2021-02-11 MED ORDER — AMLODIPINE BESYLATE 5 MG PO TABS: 5.0000 mg | ORAL_TABLET | Freq: Every day | ORAL | Status: DC

## 2021-02-11 MED ORDER — ORAL CARE MOUTH RINSE: 15.0000 mL | Freq: Once | OROMUCOSAL | Status: AC

## 2021-02-11 MED ORDER — THROMBIN 5000 UNITS EX SOLR
CUTANEOUS | Status: AC
  Filled 2021-02-11: qty 10000

## 2021-02-11 MED ORDER — THROMBIN 5000 UNITS EX SOLR
CUTANEOUS | Status: DC | PRN
  Administered 2021-02-11: 5000 [IU] via TOPICAL

## 2021-02-11 MED ORDER — METHOCARBAMOL 1000 MG/10ML IJ SOLN
500.0000 mg | Freq: Four times a day (QID) | INTRAVENOUS | Status: DC | PRN
  Filled 2021-02-11: qty 5

## 2021-02-11 MED ORDER — PHENYLEPHRINE HCL-NACL 20-0.9 MG/250ML-% IV SOLN
INTRAVENOUS | Status: DC | PRN
  Administered 2021-02-11: 25 ug/min via INTRAVENOUS

## 2021-02-11 MED ORDER — FENTANYL CITRATE (PF) 250 MCG/5ML IJ SOLN
INTRAMUSCULAR | Status: DC | PRN
  Administered 2021-02-11: 100 ug via INTRAVENOUS

## 2021-02-11 MED ORDER — BISACODYL 10 MG RE SUPP: 10.0000 mg | Freq: Every day | RECTAL | Status: DC | PRN

## 2021-02-11 MED ORDER — CEFAZOLIN SODIUM-DEXTROSE 2-4 GM/100ML-% IV SOLN: 2.0000 g | Freq: Three times a day (TID) | INTRAVENOUS | Status: DC

## 2021-02-11 MED ORDER — OXYCODONE-ACETAMINOPHEN 5-325 MG PO TABS
1.0000 | ORAL_TABLET | ORAL | Status: DC | PRN
Start: 2021-02-11 — End: 2021-02-11

## 2021-02-11 SURGICAL SUPPLY — 48 items
BAG COUNTER SPONGE SURGICOUNT (BAG) ×2 IMPLANT
BAG SURGICOUNT SPONGE COUNTING (BAG) ×1
BAND RUBBER #18 3X1/16 STRL (MISCELLANEOUS) IMPLANT
BLADE CLIPPER SURG (BLADE) ×2 IMPLANT
BUR ACORN 6.0 (BURR) ×1 IMPLANT
BUR ACORN 6.0MM (BURR) ×1
BUR MATCHSTICK NEURO 3.0 LAGG (BURR) ×3 IMPLANT
CANISTER SUCT 3000ML PPV (MISCELLANEOUS) ×3 IMPLANT
DECANTER SPIKE VIAL GLASS SM (MISCELLANEOUS) ×3 IMPLANT
DERMABOND ADVANCED (GAUZE/BANDAGES/DRESSINGS) ×2
DERMABOND ADVANCED .7 DNX12 (GAUZE/BANDAGES/DRESSINGS) ×1 IMPLANT
DEVICE DISSECT PLASMABLAD 3.0S (MISCELLANEOUS) ×1 IMPLANT
DRAPE HALF SHEET 40X57 (DRAPES) IMPLANT
DRAPE LAPAROTOMY T 102X78X121 (DRAPES) ×3 IMPLANT
DRAPE MICROSCOPE LEICA (MISCELLANEOUS) IMPLANT
DURAPREP 26ML APPLICATOR (WOUND CARE) ×3 IMPLANT
ELECT REM PT RETURN 9FT ADLT (ELECTROSURGICAL) ×3
ELECTRODE REM PT RTRN 9FT ADLT (ELECTROSURGICAL) ×1 IMPLANT
GAUZE 4X4 16PLY ~~LOC~~+RFID DBL (SPONGE) IMPLANT
GAUZE SPONGE 4X4 12PLY STRL (GAUZE/BANDAGES/DRESSINGS) ×3 IMPLANT
GLOVE SURG LTX SZ8.5 (GLOVE) ×3 IMPLANT
GLOVE SURG UNDER POLY LF SZ8.5 (GLOVE) ×3 IMPLANT
GOWN STRL REUS W/ TWL LRG LVL3 (GOWN DISPOSABLE) IMPLANT
GOWN STRL REUS W/ TWL XL LVL3 (GOWN DISPOSABLE) IMPLANT
GOWN STRL REUS W/TWL 2XL LVL3 (GOWN DISPOSABLE) ×3 IMPLANT
GOWN STRL REUS W/TWL LRG LVL3 (GOWN DISPOSABLE)
GOWN STRL REUS W/TWL XL LVL3 (GOWN DISPOSABLE) ×6
KIT BASIN OR (CUSTOM PROCEDURE TRAY) ×3 IMPLANT
KIT TURNOVER KIT B (KITS) ×3 IMPLANT
NDL SPNL 20GX3.5 QUINCKE YW (NEEDLE) IMPLANT
NEEDLE HYPO 22GX1.5 SAFETY (NEEDLE) ×3 IMPLANT
NEEDLE SPNL 20GX3.5 QUINCKE YW (NEEDLE) IMPLANT
NS IRRIG 1000ML POUR BTL (IV SOLUTION) ×3 IMPLANT
PACK LAMINECTOMY NEURO (CUSTOM PROCEDURE TRAY) ×3 IMPLANT
PAD ARMBOARD 7.5X6 YLW CONV (MISCELLANEOUS) ×9 IMPLANT
PATTIES SURGICAL .5 X.5 (GAUZE/BANDAGES/DRESSINGS) ×2 IMPLANT
PATTIES SURGICAL .5 X1 (DISPOSABLE) ×3 IMPLANT
PATTIES SURGICAL 1X1 (DISPOSABLE) ×2 IMPLANT
PLASMABLADE 3.0S (MISCELLANEOUS) ×3
SPONGE SURGIFOAM ABS GEL SZ50 (HEMOSTASIS) ×3 IMPLANT
SUT VIC AB 1 CT1 18XBRD ANBCTR (SUTURE) ×1 IMPLANT
SUT VIC AB 1 CT1 8-18 (SUTURE) ×3
SUT VIC AB 2-0 CP2 18 (SUTURE) ×3 IMPLANT
SUT VIC AB 3-0 SH 8-18 (SUTURE) ×3 IMPLANT
SUT VIC AB 4-0 RB1 18 (SUTURE) ×3 IMPLANT
TOWEL GREEN STERILE (TOWEL DISPOSABLE) ×3 IMPLANT
TOWEL GREEN STERILE FF (TOWEL DISPOSABLE) ×3 IMPLANT
WATER STERILE IRR 1000ML POUR (IV SOLUTION) ×3 IMPLANT

## 2021-02-11 NOTE — Discharge Instructions (Signed)
Wound Care °Leave incision open to air. °You may shower. °Do not scrub directly on incision.  °Do not put any creams, lotions, or ointments on incision. °Activity °Walk each and every day, increasing distance each day. °No lifting greater than 5 lbs.  Avoid excessive neck motion. °No driving for 2 weeks; may ride as a passenger locally. ° °Diet °Resume your normal diet.  °Return to Work °Will be discussed at you follow up appointment. °Call Your Doctor If Any of These Occur °Redness, drainage, or swelling at the wound.  °Temperature greater than 101 degrees. °Severe pain not relieved by pain medication. °Increased difficulty swallowing. °Incision starts to come apart. °Follow Up Appt °Call today for appointment in 4 weeks ([phone]) or for problems.  If you have any hardware placed in your spine, you will need an x-ray before your appointment. °

## 2021-02-11 NOTE — Discharge Summary (Signed)
Physician Discharge Summary  Patient ID: Jacob Hess MRN: [id number] DOB/AGE: [birth year] 80 y.o.  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  Spondylolisthesis lumbar region.  Lumbar radiculopathy.  Neurogenic claudication. Principal Problem:   Spondylolisthesis of lumbar region   Discharged Condition: good  Hospital Course: Patient was admitted to undergo surgical decompression which he tolerated well.  He is stable  Consults: None  Significant Diagnostic Studies: None  Treatments: surgery: See op note  Discharge Exam: Blood pressure (!) 146/79, pulse 63, temperature 97.6 F (36.4 C), resp. rate 18, height 5\' 9"  (1.753 m), weight 77.1 kg, SpO2 99 %. Incision is clean and dry Station and gait are intact.  Disposition: Discharge disposition: 01-Home or Self Care       Discharge Instructions     Call MD for:  redness, tenderness, or signs of infection (pain, swelling, redness, odor or green/yellow discharge around incision site)   Complete by: As directed    Call MD for:  severe uncontrolled pain   Complete by: As directed    Call MD for:  temperature >100.4   Complete by: As directed    Diet - low sodium heart healthy   Complete by: As directed    Discharge wound care:   Complete by: As directed    Okay to shower. Do not apply salves or appointments to incision. No heavy lifting with the upper extremities greater than 10 pounds. May resume driving when not requiring pain medication and patient feels comfortable with doing so.   Incentive spirometry RT   Complete by: As directed    Increase activity slowly   Complete by: As directed       Allergies as of 02/11/2021       Reactions   Ace Inhibitors Cough   Ramipril Cough        Medication List     TAKE these medications    amLODipine 5 MG tablet Commonly known as: NORVASC Take 5 mg by mouth at  bedtime.   aspirin EC 81 MG tablet Take 81 mg by mouth daily.   atorvastatin 80 MG tablet Commonly known as: LIPITOR Take 80 mg by mouth at bedtime.   losartan 50 MG tablet Commonly known as: COZAAR Take 50 mg by mouth at bedtime.   methocarbamol 500 MG tablet Commonly known as: ROBAXIN Take 1 tablet (500 mg total) by mouth every 6 (six) hours as needed for muscle spasms.   metoprolol succinate 100 MG 24 hr tablet Commonly known as: TOPROL-XL Take 100 mg by mouth at bedtime.   Omega-3 Fish Oil 1200 MG Caps Take 1 capsule by mouth daily.   traMADol 50 MG tablet Commonly known as: Ultram Take 1 tablet (50 mg total) by mouth every 6 (six) hours as needed.   Vitamin D (Ergocalciferol) 1.25 MG (50000 UNIT) Caps capsule Commonly known as: DRISDOL Take 50,000 Units by mouth every Sunday.               Discharge Care Instructions  (From admission, onward)           Start     Ordered   02/11/21 0000  Discharge wound care:       Comments: Okay to shower. Do not apply salves or appointments to incision. No heavy lifting with the upper extremities greater than 10 pounds. May resume driving when not requiring pain medication and patient feels comfortable with doing so.  02/11/21 1533             Signed: Shary Key Keontre Defino 02/11/2021, 3:33 PM

## 2021-02-11 NOTE — Anesthesia Postprocedure Evaluation (Signed)
Anesthesia Post Note  Patient: Jacob Hess  Procedure(s) Performed: Bilateral Lumbar Four Sacral One Laminotomy, foraminotomy (Bilateral: Spine Lumbar)     Patient location during evaluation: PACU Anesthesia Type: General Level of consciousness: awake and alert Pain management: pain level controlled Vital Signs Assessment: post-procedure vital signs reviewed and stable Respiratory status: spontaneous breathing, nonlabored ventilation and respiratory function stable Cardiovascular status: blood pressure returned to baseline and stable Postop Assessment: no apparent nausea or vomiting Anesthetic complications: no   No notable events documented.  Last Vitals:  Vitals:   02/11/21 1026 02/11/21 1041  BP: 137/82 138/86  Pulse: (!) 59 (!) 59  Resp: 12 13  Temp:    SpO2: 100% 100%    Last Pain:  Vitals:   02/11/21 1041  TempSrc:   PainSc: 0-No pain    LLE Motor Response: Purposeful movement (02/11/21 1041) LLE Sensation: Full sensation (02/11/21 1041) RLE Motor Response: Purposeful movement (02/11/21 1041) RLE Sensation: Full sensation (02/11/21 1041)      Anniemae Haberkorn,W. EDMOND

## 2021-02-11 NOTE — H&P (Signed)
Jacob Hess is an 80 y.o. male.   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  An MRI was performed which demonstrates the presence of a spondylolisthesis at what would typically be called L4-L5 however it appears that the patient has a sacralized fifth lumbar vertebrae and on his MRI report is noted at L4 S1.  He has a high-grade stenosis at this level.  Plain x-rays show that he does not have significant motion at the L4 S1 level and after careful consideration of his options I advised surgical decompression at the L4 S1 level via bilateral laminotomies.  He is now admitted for this procedure.  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 He has never used smokeless tobacco. He reports current alcohol use. He reports that he does not use drugs.  Allergies:  Allergies  Allergen Reactions   Ace Inhibitors Cough   Ramipril Cough    Medications Prior to Admission  Medication Sig Dispense Refill   amLODipine (NORVASC) 5 MG tablet Take 5 mg by mouth at bedtime.     atorvastatin (LIPITOR) 80 MG tablet Take 80 mg by mouth at bedtime.     losartan (COZAAR) 50 MG tablet Take 50 mg by mouth at bedtime.     metoprolol succinate (TOPROL-XL) 100 MG 24 hr tablet Take 100 mg by mouth at bedtime.  3   Vitamin D, Ergocalciferol, (DRISDOL) 50000 UNITS CAPS Take 50,000 Units by mouth every Sunday.     aspirin EC 81 MG tablet Take  81 mg by mouth daily.     Omega-3 Fatty Acids (OMEGA-3 FISH OIL) 1200 MG CAPS Take 1 capsule by mouth daily.      Results for orders placed or performed during the hospital encounter of 02/11/21 (from the past 48 hour(s))  Glucose, capillary     Status: Abnormal   Collection Time: 02/11/21  5:56 AM  Result Value Ref Range   Glucose-Capillary 111 (H) 70 - 99 mg/dL    Comment: Glucose reference range applies only to samples taken after fasting for at least 8 hours.   No results found.  Review of Systems  Constitutional:  Positive for activity change.  Musculoskeletal:  Positive for back pain, gait problem and myalgias.  Neurological:  Positive for weakness and numbness.  All other systems reviewed and are negative.  Blood pressure 138/70, pulse 65, temperature 98.3 F (36.8 C), temperature source Oral, resp. rate 17, height 5\' 9"  (1.753 m), weight 77.1 kg, SpO2 100 %. Physical Exam Constitutional:      Appearance: Normal appearance. He is normal weight.  HENT:     Head: Normocephalic and atraumatic.     Nose: Nose normal.     Mouth/Throat:     Mouth: Mucous membranes are moist.  Eyes:     Extraocular Movements: Extraocular movements intact.     Conjunctiva/sclera: Conjunctivae normal.  Pupils: Pupils are equal, round, and reactive to light.  Cardiovascular:     Rate and Rhythm: Normal rate and regular rhythm.     Pulses: Normal pulses.     Heart sounds: Normal heart sounds.  Pulmonary:     Effort: Pulmonary effort is normal.     Breath sounds: Normal breath sounds.  Abdominal:     General: Abdomen is flat. Bowel sounds are normal.     Palpations: Abdomen is soft.  Musculoskeletal:        General: Normal range of motion.     Cervical back: Normal range of motion and neck supple.  Skin:    General: Skin is warm and dry.     Capillary Refill: Capillary refill takes less than 2 seconds.  Neurological:     General: No focal deficit present.     Mental Status: He is  alert and oriented to person, place, and time.  Psychiatric:        Mood and Affect: Mood normal.        Behavior: Behavior normal.        Thought Content: Thought content normal.        Judgment: Judgment normal.     Assessment/Plan Spondylolisthesis L4 S1 with neurogenic claudication, lumbar radiculopathy.  Plan: Bilateral laminotomies and foraminotomies L4 S1.  Stefani Dama, MD 02/11/2021, 7:46 AM

## 2021-02-11 NOTE — Anesthesia Procedure Notes (Signed)
Procedure Name: Intubation Date/Time: 02/11/2021 8:05 AM Performed by: Darryl Nestle, CRNA Pre-anesthesia Checklist: Patient identified, Emergency Drugs available, Suction available and Patient being monitored Patient Re-evaluated:Patient Re-evaluated prior to induction Oxygen Delivery Method: Circle system utilized Preoxygenation: Pre-oxygenation with 100% oxygen Induction Type: IV induction Ventilation: Mask ventilation without difficulty Laryngoscope Size: Miller and 2 Grade View: Grade II Tube type: Oral Number of attempts: 1 Airway Equipment and Method: Stylet Placement Confirmation: ETT inserted through vocal cords under direct vision, positive ETCO2 and breath sounds checked- equal and bilateral Secured at: 23 cm Tube secured with: Tape Dental Injury: Teeth and Oropharynx as per pre-operative assessment

## 2021-02-11 NOTE — Progress Notes (Signed)
Pt doing well. Pt given D/C instructions with verbal understanding. Rx's were sent to the pharmacy by MD. Pt incision is clean and dry with no sign of infection. Pt's IV was removed prior to D/C. Pt D/C'd home via wheelchair per MD order. Pt is stable @ D/C and has no other needs at this time. Rema Fendt, RN

## 2021-02-11 NOTE — Op Note (Signed)
Date of surgery: 02/11/2021 Preoperative diagnosis: Spondylolisthesis L4 S1, lumbar spinal stenosis with neurogenic claudication, lumbar radiculopathy Postoperative diagnosis: Same Procedure: Bilateral laminotomies and foraminotomies L4 S1 with decompression of the thecal sac and nerve roots Surgeon: Barnett Abu Anesthesia: General endotracheal Indications: The patient is a 80 year old individual whose had progressive pain in the back with weakness in the legs and he has a noted spondylolisthesis at L4 S1.  He has a sacralized L5 vertebrae.  There is a grade 1 spondylolisthesis with a high-grade stenosis at that level.  He has been advised regarding surgery to decompress this region.  Procedure: Patient was brought to the operating room supine on the stretcher.  After the smooth induction of general endotracheal anesthesia was carefully turned prone.  The back was prepped with alcohol DuraPrep and draped in a sterile fashion.  Midline incision was created in the lower lumbar spine and carried down to the lumbodorsal fascia after localizing the L4 S1 level.  A self-retaining retractor was placed in the subperiosteal fashion to expose the laminar arch and interlaminar space at L4 S1.  Then using high-speed drill we remove the inferior margin lamina of L4 out to the mesial wall the facet.  A mesial facetectomy was performed.  The L ligament was thickened and redundant in this area on both sides.  Then by carefully elevating the yellow ligament we are able to undercut the L ligament from the inferior arch of L4.  This freed the L ligament and by dissecting inferiorly we noted that there was a significant cystic structure in the midline that was adherent to the dura.  By dissecting around this from the right side and then the left side were able to free portions of this cyst by dissecting it away from the dura very cautiously.  In the end a 1 cm long 1 cm wide by 8 mm thick synovial cyst was removed.  This  allowed for good additional decompression of the common dural tube.  The foraminotomies were then created superiorly for the L4 nerve root to decompress the lateral recess using a combination of curved rongeurs and upgoing curettes.  Inferiorly we decompressed the L5 nerve root which had significant hypertrophied ligamentous material compressing it into the lateral recesses.  This alleviated the lateral recesses in the subarticular space.  This was done bilaterally.  Hemostasis was easily achieved.  In the end the pads of the superior and inferior nerve roots were free and clear as was the common dural tube.  Then 25 cc of half percent Marcaine was injected into the surrounding tissues in the paraspinous fascia.  Lumbodorsal fascia was then closed with #1 Vicryl in interrupted fashion 2-0 Vicryl was used in the subcutaneous tissues 3-0 Vicryl subcuticularly along with several 4-0 Vicryl in a subcuticular fashion Dermabond was placed on the skin blood loss was estimated less than 20 cc.

## 2021-02-11 NOTE — Evaluation (Signed)
Occupational Therapy Evaluation Patient Details Name: Jacob Hess MRN: [id number] DOB: 05-28-1940 Today's Date: 02/11/2021   History of Present Illness 80 y.o. male presenting for elective bilateral L4-S1 PLIF. PMHx significant for CAD, HTN and pre-diabetes.   Clinical Impression   PTA patient was living with his spouse in a 2-level private residence and was independent with ADLs/IADLs without AD. Patient currently presents near baseline level of function demonstrating observed ADLs and short-distance mobility with supervision A grossly for line management only. No AD needed. OT provided education on spinal precautions, home set-up to maximize safety and independence with self-care tasks, and acquisition/use of AE. Patient/family expressed verbal understanding. Patient does not require continued acute occupational therapy services with OT to sign off at this time.         Recommendations for follow up therapy are one component of a multi-disciplinary discharge planning process, led by the attending physician.  Recommendations may be updated based on patient status, additional functional criteria and insurance authorization.   Follow Up Recommendations  No OT follow up    Assistance Recommended at Discharge PRN  Functional Status Assessment  Patient has not had a recent decline in their functional status  Equipment Recommendations  None recommended by OT    Recommendations for Other Services       Precautions / Restrictions Precautions Precautions: Back Precaution Booklet Issued: Yes (comment) Precaution Comments: Written and verbal edcuation provided. Good recall during simulated ADLs. Required Braces or Orthoses: Other Brace Other Brace: No brace needed Restrictions Weight Bearing Restrictions: No      Mobility Bed Mobility Overal bed mobility: Needs Assistance             General bed mobility comments: Patient seated EOB upon entry.    Transfers Overall  transfer level: Needs assistance Equipment used: None Transfers: Sit to/from Stand Sit to Stand: Supervision           General transfer comment: Supervision A for line management.      Balance Overall balance assessment: No apparent balance deficits (not formally assessed)                                         ADL either performed or assessed with clinical judgement   ADL Overall ADL's : Needs assistance/impaired Eating/Feeding: Independent   Grooming: Supervision/safety;Standing           Upper Body Dressing : Set up;Sitting   Lower Body Dressing: Supervision/safety;Sit to/from stand   Toilet Transfer: Radiographer, therapeutic Details (indicate cue type and reason): Simulated with transfer to recliner.         Functional mobility during ADLs: Supervision/safety       Vision Baseline Vision/History: 1 Wears glasses (readers) Ability to See in Adequate Light: 0 Adequate Patient Visual Report: No change from baseline Vision Assessment?: No apparent visual deficits     Perception     Praxis      Pertinent Vitals/Pain Pain Assessment: Faces Faces Pain Scale: Hurts a little bit Pain Location: Incisional Pain Descriptors / Indicators: Sore Pain Intervention(s): Limited activity within patient's tolerance;Monitored during session;Premedicated before session;Repositioned     Hand Dominance     Extremity/Trunk Assessment Upper Extremity Assessment Upper Extremity Assessment: Overall WFL for tasks assessed   Lower Extremity Assessment Lower Extremity Assessment: Defer to PT evaluation   Cervical / Trunk Assessment Cervical / Trunk Assessment: Back Surgery   Communication  Communication Communication: No difficulties   Cognition Arousal/Alertness: Awake/alert Behavior During Therapy: WFL for tasks assessed/performed Overall Cognitive Status: Within Functional Limits for tasks assessed                                        General Comments  Clean/dry incision open to room air.    Exercises     Shoulder Instructions      Home Living Family/patient expects to be discharged to:: Private residence Living Arrangements: Spouse/significant other Available Help at Discharge: Family Type of Home: House Home Access: Stairs to enter [employer] of Steps: 4 Entrance Stairs-Rails: Left Home Layout: Multi-level;Bed/bath upstairs Alternate Level Stairs-Number of Steps: Full flight   Bathroom Shower/Tub: Producer, television/film/video: Standard     Home Equipment: Shower seat - built in          Prior Functioning/Environment Prior Level of Function : Independent/Modified Independent;Working/employed               ADLs Comments: Enjoys Banker Problem List:        OT Treatment/Interventions:      OT Goals(Current goals can be found in the care plan section) Acute Rehab OT Goals Patient Stated Goal: To return home. OT Goal Formulation: With patient Time For Goal Achievement: 02/25/21 Potential to Achieve Goals: Good  OT Frequency:     Barriers to D/C:            Co-evaluation              AM-PAC OT "6 Clicks" Daily Activity     Outcome Measure Help from another person eating meals?: None Help from another person taking care of personal grooming?: A Little Help from another person toileting, which includes using toliet, bedpan, or urinal?: A Little Help from another person bathing (including washing, rinsing, drying)?: A Little Help from another person to put on and taking off regular upper body clothing?: A Little Help from another person to put on and taking off regular lower body clothing?: A Little 6 Click Score: 19   End of Session Nurse Communication: Mobility status  Activity Tolerance: Patient tolerated treatment well Patient left: in chair;with call bell/phone within reach;with family/visitor present  OT Visit  Diagnosis: Muscle weakness (generalized) (M62.81)                Time: 1245-8099 OT Time Calculation (min): 20 min Charges:  OT General Charges $OT Visit: 1 Visit OT Evaluation $OT Eval Low Complexity: 1 Low  Jacob Hess H. OTR/L Supplemental OT, Department of rehab services [phone]  Jacob Hess H. 02/11/2021, 12:32 PM

## 2021-02-11 NOTE — Transfer of Care (Signed)
Immediate Anesthesia Transfer of Care Note  Patient: Jacob Hess  Procedure(s) Performed: Bilateral Lumbar Four Sacral One Laminotomy, foraminotomy (Bilateral: Spine Lumbar)  Patient Location: PACU  Anesthesia Type:General  Level of Consciousness: awake, alert  and oriented  Airway & Oxygen Therapy: Patient Spontanous Breathing  Post-op Assessment: Report given to RN and Post -op Vital signs reviewed and stable  Post vital signs: Reviewed and stable  Last Vitals:  Vitals Value Taken Time  BP 143/76 02/11/21 0941  Temp    Pulse 61 02/11/21 0942  Resp 12 02/11/21 0942  SpO2 100 % 02/11/21 0942  Vitals shown include unvalidated device data.  Last Pain:  Vitals:   02/11/21 0556  TempSrc:   PainSc: 0-No pain         Complications: No notable events documented.

## 2021-02-12 ENCOUNTER — Encounter (HOSPITAL_COMMUNITY): Payer: Self-pay | Admitting: Neurological Surgery

## 2021-07-14 ENCOUNTER — Ambulatory Visit: Payer: Medicare Other | Admitting: Cardiovascular Disease

## 2021-10-01 ENCOUNTER — Ambulatory Visit (INDEPENDENT_AMBULATORY_CARE_PROVIDER_SITE_OTHER): Payer: Medicare Other | Admitting: Cardiovascular Disease

## 2021-10-01 ENCOUNTER — Encounter: Payer: Self-pay | Admitting: Cardiovascular Disease

## 2021-10-01 VITALS — BP 120/76 | HR 51 | Ht 69.5 in | Wt 175.4 lb

## 2021-10-01 DIAGNOSIS — I422 Other hypertrophic cardiomyopathy: Secondary | ICD-10-CM | POA: Diagnosis not present

## 2021-10-01 DIAGNOSIS — E782 Mixed hyperlipidemia: Secondary | ICD-10-CM | POA: Diagnosis not present

## 2021-10-01 DIAGNOSIS — E875 Hyperkalemia: Secondary | ICD-10-CM

## 2021-10-01 DIAGNOSIS — I10 Essential (primary) hypertension: Secondary | ICD-10-CM | POA: Diagnosis not present

## 2021-10-01 NOTE — Patient Instructions (Signed)
Medication Instructions:  Your physician recommends that you continue on your current medications as directed. Please refer to the Current Medication list given to you today.  *If you need a refill on your cardiac medications before your next appointment, please call your pharmacy*   Lab Work: BMET today If you have labs (blood work) drawn today and your tests are completely normal, you will receive your results only by: MyChart Message (if you have MyChart) OR A paper copy in the mail If you have any lab test that is abnormal or we need to change your treatment, we will call you to review the results.   Testing/Procedures: ECHO Your physician has requested that you have an echocardiogram. Echocardiography is a painless test that uses sound waves to create images of your heart. It provides your doctor with information about the size and shape of your heart and how well your heart's chambers and valves are working. This procedure takes approximately one hour. There are no restrictions for this procedure.  Exercise Stress ECHO Your physician has requested that you have a stress echocardiogram. For further information please visit [email or web address] Please follow instruction sheet as given.  Follow-Up: At [facility], you and your health needs are our priority.  As part of our continuing mission to provide you with exceptional heart care, we have created designated Provider Care Teams.  These Care Teams include your primary Cardiologist (physician) and Advanced Practice Providers (APPs -  Physician Assistants and Nurse Practitioners) who all work together to provide you with the care you need, when you need it.  Your next appointment:   1 year(s)  The format for your next appointment:   In Person  Provider:   Tonny Bollman, MD    Important Information About Sugar

## 2021-10-01 NOTE — Progress Notes (Signed)
Cardiology Office Note:    Date:  10/01/2021   ID:  Jacob Hess, DOB 31-Mar-1940, MRN [id number]  PCP:  Gordan Payment., MD   [facility] Providers Cardiologist:  Tonny Bollman, MD     Referring MD: Gordan Payment., MD   Chief Complaint  Patient presents with   Follow-up    Hypertrophic cardiomyopathy    History of Present Illness:    Jacob Hess is a 81 y.o. 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  Genetic testing for hypertrophic cardiomyopathy has been negative.  He has a long history of false positive stress testing.  Coronary CT angiography has demonstrated nonobstructive coronary disease.  The patient is here alone today.  He is doing well from a cardiac perspective.  He underwent lumbar back surgery in December 2022 and has had a relatively good recovery from this.  He is back to playing golf again and exercising regularly.  He exercises in short intervals but is able to exercise for a combined time about 30 minutes on a regular basis.  He denies any recent episodes of lightheadedness, near syncope, chest pain, chest pressure, or shortness of breath.  He remains very compliant with his medications.  Past Medical History:  Diagnosis Date   Coronary artery disease    HOCM (hypertrophic obstructive cardiomyopathy) (HCC)    Hypertension    Pre-diabetes     Past Surgical History:  Procedure Laterality Date   Benign Tumor  1970   Left Pectoral Fascia   LUMBAR LAMINECTOMY/DECOMPRESSION MICRODISCECTOMY Bilateral 02/11/2021   Procedure: Bilateral Lumbar Four Sacral One Laminotomy, foraminotomy;  Surgeon: Barnett Abu, MD;  Location: [facility] OR;  Service: Neurosurgery;  Laterality: Bilateral;  3C/RM 19    Current Medications: Current Meds  Medication Sig   amLODipine (NORVASC) 5 MG tablet Take 5 mg by mouth at  bedtime. Per patient taking 7.5 mg daily   aspirin EC 81 MG tablet Take 81 mg by mouth daily.   atorvastatin (LIPITOR) 80 MG tablet Take 80 mg by mouth at bedtime.   losartan (COZAAR) 25 MG tablet Take 25 mg by mouth daily.   metoprolol succinate (TOPROL-XL) 100 MG 24 hr tablet Take 100 mg by mouth at bedtime.   Omega-3 Fatty Acids (OMEGA-3 FISH OIL) 1200 MG CAPS Take 1 capsule by mouth daily.   Vitamin D, Ergocalciferol, (DRISDOL) 50000 UNITS CAPS Take 50,000 Units by mouth every Sunday.     Allergies:   Ace inhibitors and Ramipril   Social History   Socioeconomic History   Marital status: Married    Spouse name: Not on file   Number of children: Not on file   Years of education: Not on file   Highest education level: Not on file  Occupational History   Not on file  Tobacco Use   Smoking status: Never   Smokeless tobacco: Never  Vaping Use   Vaping Use: Never used  Substance and Sexual Activity   Alcohol use: Yes    Comment: 0-4 (social)   Drug use: Never   Sexual activity: Not on file  Other Topics Concern   Not on file  Social History Narrative   Not on file   Social Determinants of Health   Financial Resource Strain: Not on file  Food Insecurity: Not on file  Transportation Needs: Not on file  Physical Activity: Not on file  Stress: Not  on file  Social Connections: Not on file     Family History: The patient's family history includes Heart attack in his brother; Hypertension in his brother and sister; Stroke in his maternal grandmother.  ROS:   Please see the history of present illness.    All other systems reviewed and are negative.  EKGs/Labs/Other Studies Reviewed:    The following studies were reviewed today: Echo 06/20/2020:  1. Left ventricular ejection fraction, by estimation, is 60 to 65%. The  left ventricle has normal function. The left ventricle has no regional  wall motion abnormalities. There is severe hypertrophy of the basal septal   segment. The rest of the LV  segments demonstrate mild left ventricular hypertrophy. Left ventricular  diastolic parameters were normal.   2. No resting LVOT obstruction (similar to prior echo).   3. Right ventricular systolic function is normal. The right ventricular  size is normal. Tricuspid regurgitation signal is inadequate for assessing  PA pressure.   4. The mitral valve is normal in structure. Mild mitral valve  regurgitation. No evidence of mitral stenosis.   5. The aortic valve is tricuspid. Aortic valve regurgitation is trivial.  No aortic stenosis is present.   Comparison(s): No significant change from prior study.  GXT 05/09/19: Blood pressure demonstrated a normal response to exercise. Downsloping ST segment depression ST segment depression of 2 mm was noted during stress in the II, III and aVF leads, beginning at 4 minutes of stress, and returning to baseline after 1-5 minutes of recovery. Abnormal stress test at sub-target heart rates. Findings similar to stress test 01/2017. Stress test consistent with ischemia, though patient has a history of false positive stress tests.  Coronary CTA 04/14/2013: Non-cardiac: See separate report from [facility]. No significant findings on limited lung and soft tissue windows.   Calcium Score:  322 Agatston units   Coronary Arteries: Right dominant with no anomalies   LM:  No plaque or stenosis.   LAD system: Calcified plaque noted in the proximal LAD with mild stenosis. There was a small D1 and a moderate D2 without significant disease.   Circumflex system: Calcified plaque in the proximal LCx with minimal stenosis. There was a small ramus, a small OM1, and a moderate PLOM without significant disease.   RCA: There was calcified plaque in the proximal and in the mid RCA with minimal stenosis.  EKG:  EKG is ordered today.  The ekg ordered today demonstrates sinus bradycardia 51 bpm, otherwise within normal  limits  Recent Labs: 02/07/2021: Hemoglobin 13.7; Platelets 196  Recent Lipid Panel No results found for: "CHOL", "TRIG", "HDL", "CHOLHDL", "VLDL", "LDLCALC", "LDLDIRECT"   Risk Assessment/Calculations:           Physical Exam:    VS:  BP 120/76   Pulse (!) 51   Ht 5' 9.5" (1.765 m)   Wt 175 lb 6.4 oz (79.6 kg)   SpO2 99%   BMI 25.53 kg/m     Wt Readings from Last 3 Encounters:  10/01/21 175 lb 6.4 oz (79.6 kg)  02/11/21 170 lb (77.1 kg)  02/07/21 171 lb 3.2 oz (77.7 kg)     GEN:  Well nourished, well developed in no acute distress HEENT: Normal NECK: No JVD; No carotid bruits LYMPHATICS: No lymphadenopathy CARDIAC: RRR, 2/6 systolic murmur at the left lower sternal border RESPIRATORY:  Clear to auscultation without rales, wheezing or rhonchi  ABDOMEN: Soft, non-tender, non-distended MUSCULOSKELETAL:  No edema; No deformity  SKIN: Warm and dry  NEUROLOGIC:  Alert and oriented x 3 PSYCHIATRIC:  Normal affect   ASSESSMENT:    1. Hypertrophic cardiomyopathy (HCC)   2. Essential hypertension   3. Mixed hyperlipidemia   4. Hyperkalemia    PLAN:    In order of problems listed above:  Clinically stable, recommend updated 2D echocardiogram.  His last echocardiogram from April 2022 is reviewed with no significant outflow obstruction.  Check exercise stress echocardiogram for surveillance. Blood pressure is well controlled on low-dose losartan, metoprolol succinate, and amlodipine.  The patient reduced his losartan dose because of a combination of factors (mild hyperkalemia and overtreated blood pressure). Treated with a high intensity statin drug (atorvastatin 80 mg).  Last lipids demonstrate an LDL cholesterol of 57, HDL 39, triglycerides 87, total cholesterol 104 Check metabolic panel, losartan reduced as above      Shared Decision Making/Informed Consent The risks [chest pain, shortness of breath, cardiac arrhythmias, dizziness, blood pressure fluctuations,  myocardial infarction, stroke/transient ischemic attack, and life-threatening complications (estimated to be 1 in 10,000)], benefits (risk stratification, diagnosing coronary artery disease, treatment guidance) and alternatives of a stress or dobutamine stress echocardiogram were discussed in detail with Mr. Holleman and he agrees to proceed.    Medication Adjustments/Labs and Tests Ordered: Current medicines are reviewed at length with the patient today.  Concerns regarding medicines are outlined above.  Orders Placed This Encounter  Procedures   Basic metabolic panel   EKG 12-Lead   ECHOCARDIOGRAM COMPLETE   ECHOCARDIOGRAM STRESS TEST   No orders of the defined types were placed in this encounter.   Patient Instructions  Medication Instructions:  Your physician recommends that you continue on your current medications as directed. Please refer to the Current Medication list given to you today.  *If you need a refill on your cardiac medications before your next appointment, please call your pharmacy*   Lab Work: BMET today If you have labs (blood work) drawn today and your tests are completely normal, you will receive your results only by: MyChart Message (if you have MyChart) OR A paper copy in the mail If you have any lab test that is abnormal or we need to change your treatment, we will call you to review the results.   Testing/Procedures: ECHO Your physician has requested that you have an echocardiogram. Echocardiography is a painless test that uses sound waves to create images of your heart. It provides your doctor with information about the size and shape of your heart and how well your heart's chambers and valves are working. This procedure takes approximately one hour. There are no restrictions for this procedure.  Exercise Stress ECHO Your physician has requested that you have a stress echocardiogram. For further information please visit [email or web address] Please follow  instruction sheet as given.  Follow-Up: At [facility], you and your health needs are our priority.  As part of our continuing mission to provide you with exceptional heart care, we have created designated Provider Care Teams.  These Care Teams include your primary Cardiologist (physician) and Advanced Practice Providers (APPs -  Physician Assistants and Nurse Practitioners) who all work together to provide you with the care you need, when you need it.  Your next appointment:   1 year(s)  The format for your next appointment:   In Person  Provider:   Tonny Bollman, MD    Important Information About Sugar         Signed, Tonny Bollman, MD  10/01/2021 6:03 PM    [facility]

## 2021-10-02 LAB — BASIC METABOLIC PANEL
BUN/Creatinine Ratio: 17 (ref 10–24)
BUN: 20 mg/dL (ref 8–27)
CO2: 24 mmol/L (ref 20–29)
Calcium: 9.3 mg/dL (ref 8.6–10.2)
Chloride: 100 mmol/L (ref 96–106)
Creatinine, Ser: 1.15 mg/dL (ref 0.76–1.27)
Glucose: 110 mg/dL — ABNORMAL HIGH (ref 70–99)
Potassium: 4.6 mmol/L (ref 3.5–5.2)
Sodium: 138 mmol/L (ref 134–144)
eGFR: 65 mL/min/{1.73_m2} (ref >59)

## 2021-10-16 ENCOUNTER — Telehealth (HOSPITAL_COMMUNITY): Payer: Self-pay | Admitting: *Deleted

## 2021-10-16 ENCOUNTER — Encounter (HOSPITAL_COMMUNITY): Payer: Self-pay | Admitting: *Deleted

## 2021-10-16 NOTE — Telephone Encounter (Signed)
Attempted to call patient regarding upcoming appointment- no answer.  Letter with instructions sent to my chart.  Jacob Hess

## 2021-10-20 ENCOUNTER — Telehealth (HOSPITAL_COMMUNITY): Payer: Self-pay | Admitting: Cardiovascular Disease

## 2021-10-20 NOTE — Telephone Encounter (Signed)
Patient called and cancelled echo and stress echo due to he has COVID. He is going to rest and he will call us back when he is feeling better to reschedule.  Order will be removed from the echo Wq and when patient calls back to reschedule we will reinstate the order. Thank you

## 2021-10-22 ENCOUNTER — Ambulatory Visit (HOSPITAL_COMMUNITY): Payer: Medicare Other

## 2021-11-25 ENCOUNTER — Telehealth: Payer: Self-pay

## 2021-11-25 DIAGNOSIS — I422 Other hypertrophic cardiomyopathy: Secondary | ICD-10-CM

## 2021-11-25 NOTE — Telephone Encounter (Signed)
-----   Message from Frederic Jericho sent at 11/24/2021  1:47 PM EDT ----- Regarding: ATTESTATION Please create an Order for an ATTESTATION ( [id number]) for ordered Myoview/GXT/Stress Echocardiogram.  This must be signed by ordering Provider.  We do not accept verbal cosign.  This test will not be scheduled until ATT is obtained.

## 2021-12-18 ENCOUNTER — Telehealth (HOSPITAL_COMMUNITY): Payer: Self-pay | Admitting: *Deleted

## 2021-12-18 ENCOUNTER — Encounter (HOSPITAL_COMMUNITY): Payer: Self-pay | Admitting: *Deleted

## 2021-12-18 NOTE — Telephone Encounter (Signed)
Attempted to call patient regarding appointment- no answer, unable to leave a message.  Letter sent with instructions to my chart.  Kirstie Peri

## 2021-12-24 ENCOUNTER — Ambulatory Visit (HOSPITAL_COMMUNITY): Payer: Medicare Other

## 2021-12-24 ENCOUNTER — Ambulatory Visit (HOSPITAL_BASED_OUTPATIENT_CLINIC_OR_DEPARTMENT_OTHER): Payer: Medicare Other

## 2021-12-24 ENCOUNTER — Ambulatory Visit (HOSPITAL_COMMUNITY): Payer: Medicare Other | Attending: Internal Medicine

## 2021-12-24 DIAGNOSIS — E875 Hyperkalemia: Secondary | ICD-10-CM | POA: Diagnosis present

## 2021-12-24 DIAGNOSIS — I10 Essential (primary) hypertension: Secondary | ICD-10-CM | POA: Diagnosis present

## 2021-12-24 DIAGNOSIS — I422 Other hypertrophic cardiomyopathy: Secondary | ICD-10-CM | POA: Insufficient documentation

## 2021-12-24 DIAGNOSIS — E782 Mixed hyperlipidemia: Secondary | ICD-10-CM

## 2021-12-24 LAB — ECHOCARDIOGRAM COMPLETE
Area-P 1/2: 3.48 cm2
S' Lateral: 2.2 cm

## 2021-12-24 LAB — ECHOCARDIOGRAM STRESS TEST: Area-P 1/2: 4.13 cm2

## 2022-01-12 ENCOUNTER — Telehealth: Payer: Self-pay | Admitting: Cardiovascular Disease

## 2022-01-12 NOTE — Telephone Encounter (Signed)
Patient called Dr Excell Seltzer while in clinic and requested copies of his recent ECHO be mailed to his home. Printed and placed in mail room at this time.

## 2022-03-17 ENCOUNTER — Encounter: Payer: Self-pay | Admitting: Cardiovascular Disease

## 2022-03-17 NOTE — Progress Notes (Unsigned)
Patient called in with palpitations. He sent me a telemetry strip to review which appears to be idioventricular rhythm. He is otherwise doing fine. He's had recent labs with normal electrolytes including potassium and magnesium and I was able to review these. I would like him to have a 14 day ZIO for further evaluation. Thank you

## 2022-03-18 ENCOUNTER — Ambulatory Visit: Payer: Medicare Other | Attending: Cardiovascular Disease

## 2022-03-18 ENCOUNTER — Telehealth: Payer: Self-pay | Admitting: Cardiovascular Disease

## 2022-03-18 ENCOUNTER — Encounter: Payer: Self-pay | Admitting: Cardiovascular Disease

## 2022-03-18 DIAGNOSIS — R002 Palpitations: Secondary | ICD-10-CM

## 2022-03-18 NOTE — Progress Notes (Unsigned)
Enrolled patient for a 14 day Zio XT  monitor to be mailed to patients home  °

## 2022-03-18 NOTE — Telephone Encounter (Signed)
Sherren Mocha, MDYesterday (1:36 PM)   Patient called in with palpitations. He sent me a telemetry strip to review which appears to be idioventricular rhythm. He is otherwise doing fine. He's had recent labs with normal electrolytes including potassium and magnesium and I was able to review these. I would like him to have a 14 day ZIO for further evaluation. Thank you     Order placed at this time for 14-day zio and pt made aware via MyChart.

## 2022-03-23 DIAGNOSIS — R002 Palpitations: Secondary | ICD-10-CM | POA: Diagnosis not present

## 2022-08-22 IMAGING — CR DG LUMBAR SPINE 2-3V
2 series · 2 of 2 positions shown · non-contrast
Comparison: 01/29/2021

CLINICAL DATA: Intraoperative imaging for localization

EXAM:
LUMBAR SPINE - 2-3 VIEW

[lateral (1 of 2)]
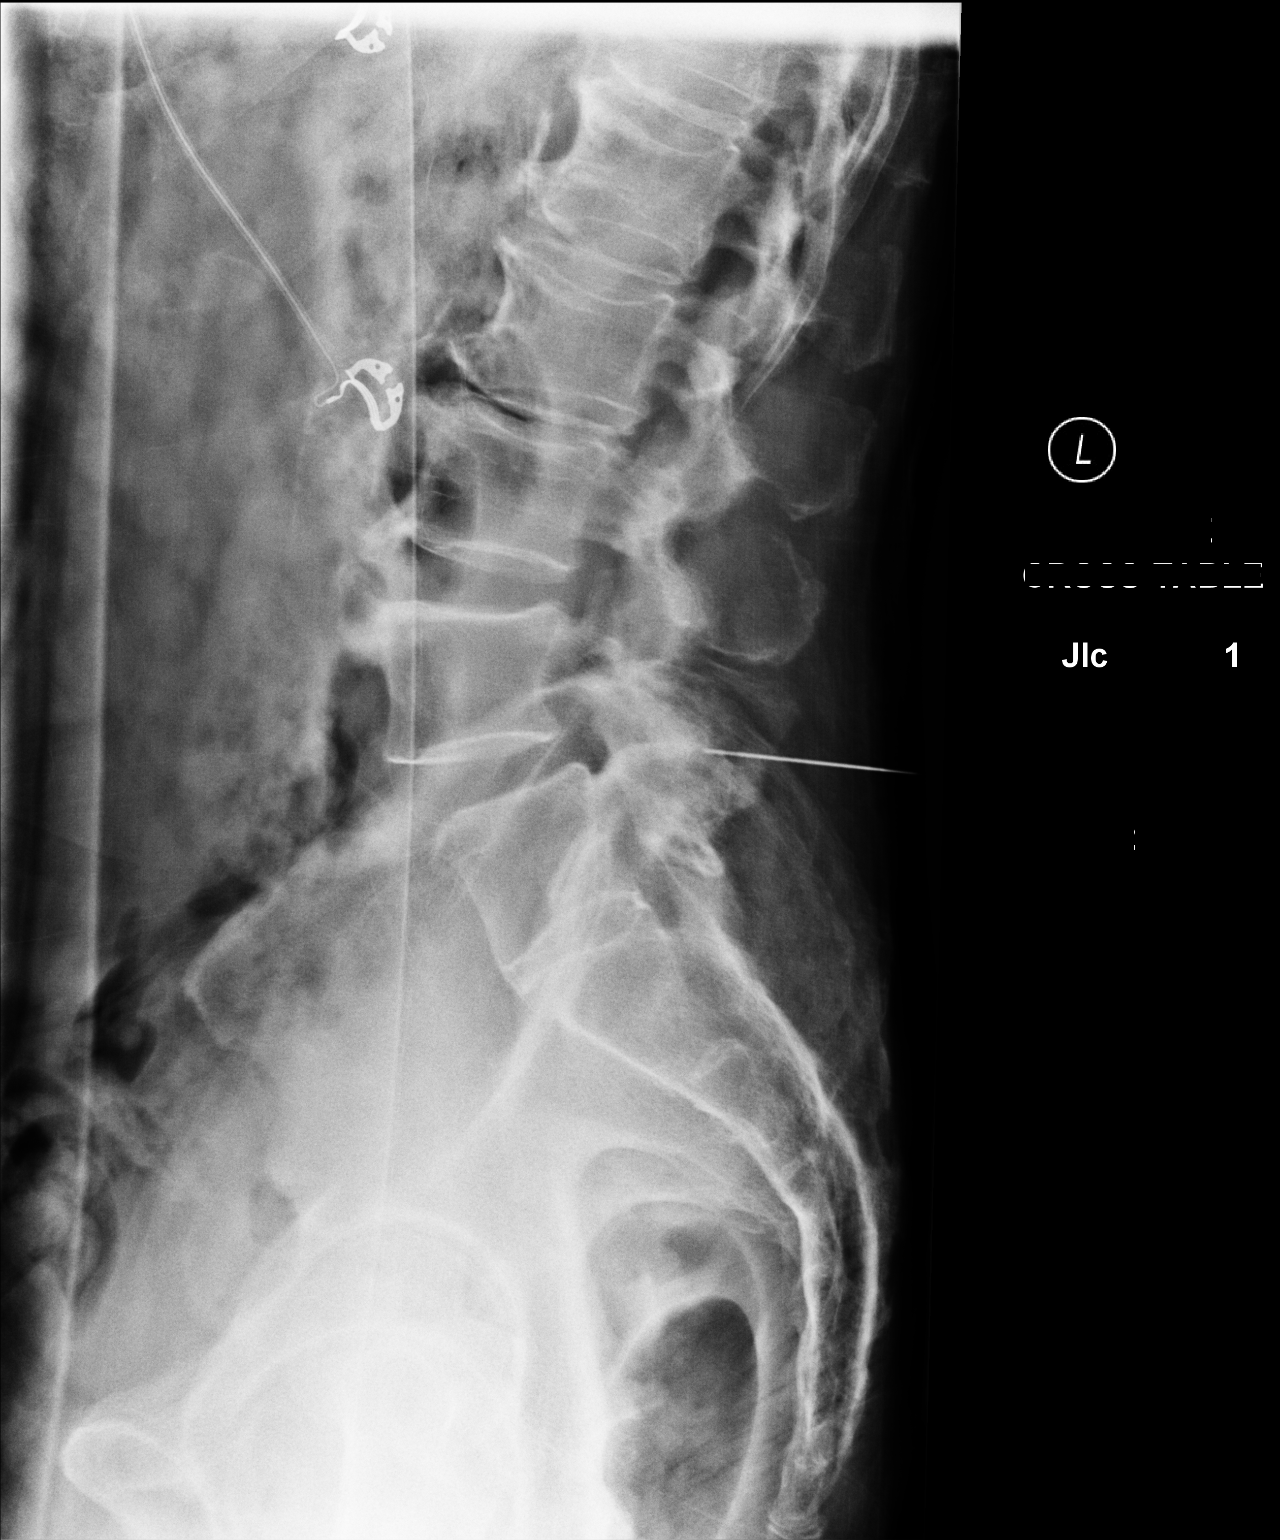

[lateral (2 of 2)]
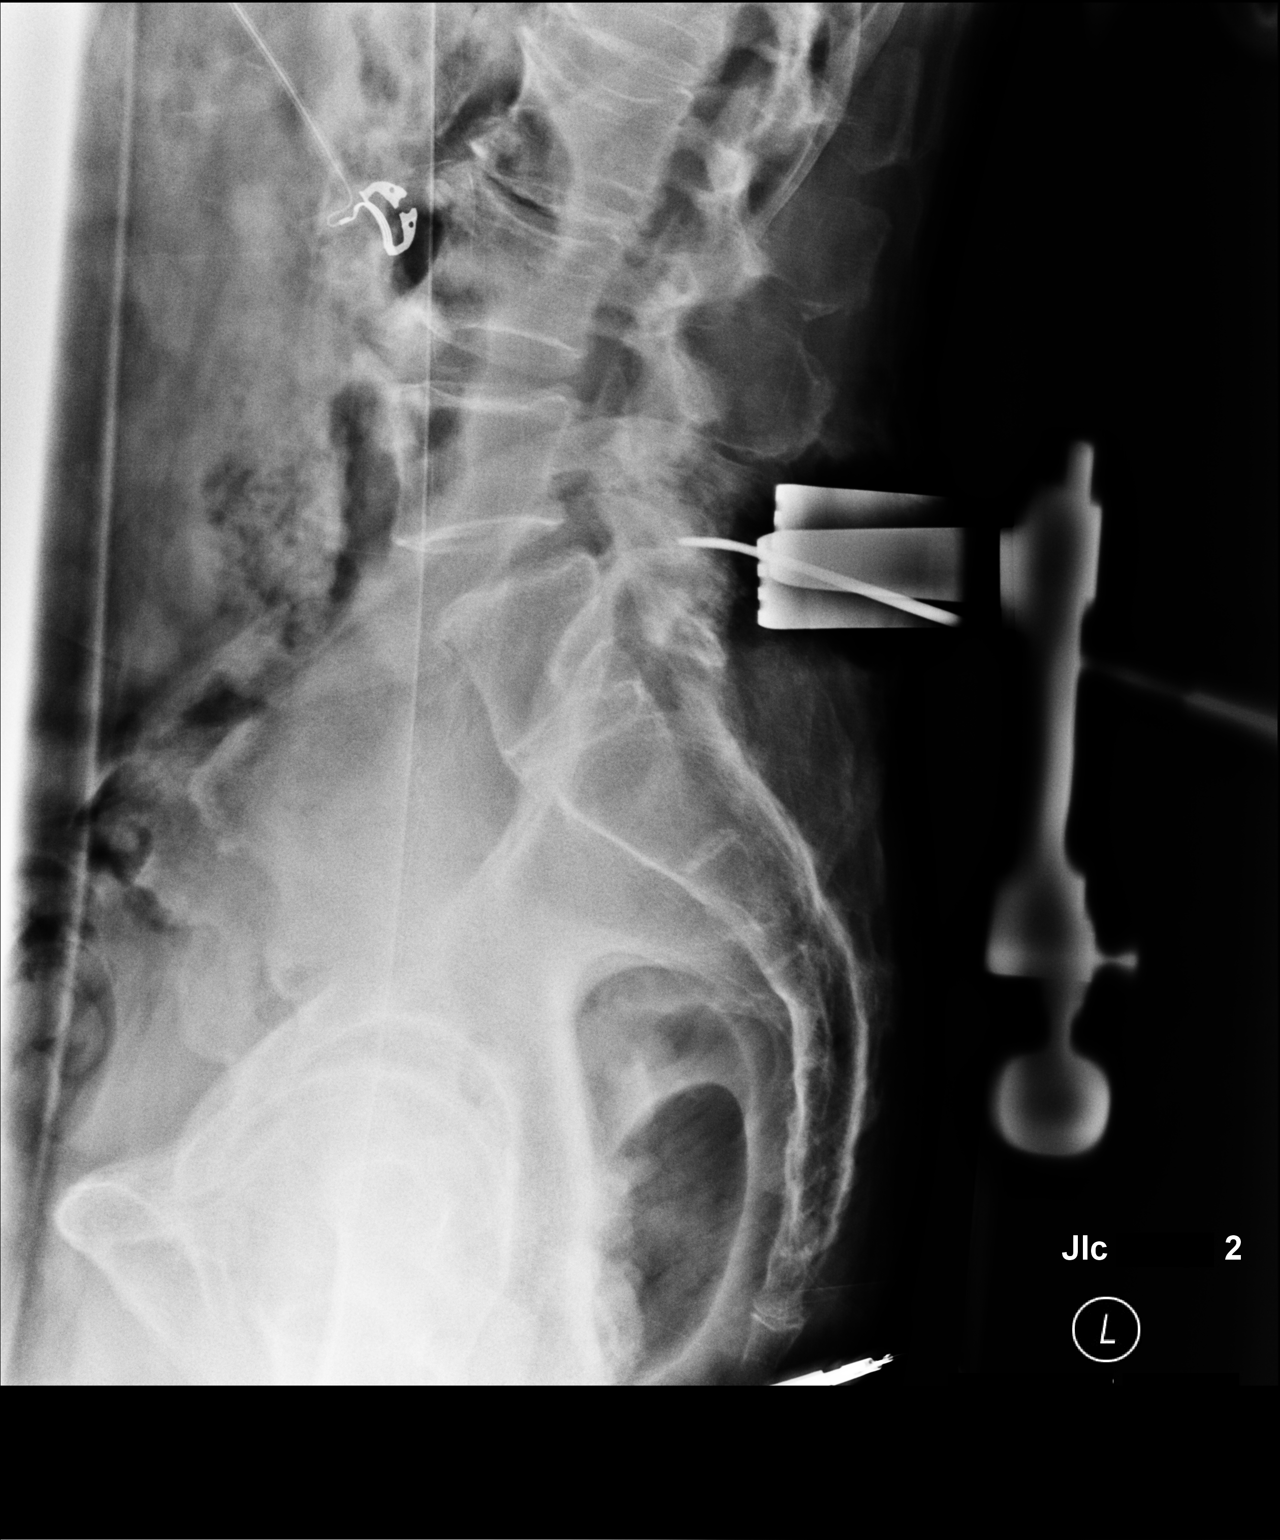

[2 of 2 positions shown; findings below may reference images not displayed]

FINDINGS: Last lumbar vertebra appears to be transitional. This report assumes
partial sacralization of L5 vertebra. There is mild anterolisthesis
at the L4-L5 level. There is proximally 5 mm offset in the
alignment. Tip of localizing probe is noted overlying the posterior
elements at L4-L5 level. Degenerative changes are noted in the
lumbar spine, especially in the lower lumbar region. Anterior bony
spurs are noted at multiple levels, more so in the upper lumbar
spine.
IMPRESSION: Cross-table lateral view was performed for localization for lumbar
spine procedure.

## 2022-12-21 ENCOUNTER — Ambulatory Visit: Payer: Medicare Other | Admitting: Cardiovascular Disease

## 2023-03-15 ENCOUNTER — Ambulatory Visit: Payer: Medicare Other | Attending: Cardiovascular Disease | Admitting: Cardiovascular Disease

## 2023-03-15 ENCOUNTER — Encounter: Payer: Self-pay | Admitting: Cardiovascular Disease

## 2023-03-15 VITALS — BP 136/80 | HR 59 | Ht 69.0 in | Wt 165.2 lb

## 2023-03-15 DIAGNOSIS — I2510 Atherosclerotic heart disease of native coronary artery without angina pectoris: Secondary | ICD-10-CM | POA: Diagnosis not present

## 2023-03-15 DIAGNOSIS — E782 Mixed hyperlipidemia: Secondary | ICD-10-CM

## 2023-03-15 DIAGNOSIS — I10 Essential (primary) hypertension: Secondary | ICD-10-CM | POA: Diagnosis present

## 2023-03-15 DIAGNOSIS — I422 Other hypertrophic cardiomyopathy: Secondary | ICD-10-CM

## 2023-03-15 NOTE — Assessment & Plan Note (Signed)
 The patient has nonobstructive CAD.  He is experiencing no anginal symptoms.

## 2023-03-15 NOTE — Progress Notes (Signed)
 Cardiology Office Note:    Date:  03/15/2023   ID:  Jacob Hess, DOB [birth year], MRN [id number]  PCP:  Jacob Cathlyn LABOR., MD   [facility] Providers Cardiologist:  Jacob Fell, MD     Referring MD: Jacob Cathlyn LABOR., MD   Chief Complaint  Patient presents with   Follow-up    Hypertrophic cardiomyopathy    History of Present Illness:    Jacob Hess is a 83 y.o. male presenting for follow-up evaluation.  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  Genetic testing for hypertrophic cardiomyopathy has been negative.  The patient has nonobstructive CAD as evaluated by gated CT coronary angiography.  The patient is here alone today.  He continues to have some functional limitation from low back problems and he underwent lumbar back surgery in 2022.  He has been treated with injections as well.  He is doing pretty well with no symptoms of chest pain, chest pressure, or shortness of breath.  He still able to do some aerobic exercise with no exertional symptoms.  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  He sometimes takes metoprolol  succinate at an increased dose of 150 mg at bedtime if his blood pressure is elevated.  He otherwise is tolerating losartan  and amlodipine  without problems.  He remains on aspirin and a high intensity statin drug as well.  He has had no recent problems with heart palpitations, leg swelling, orthopnea, or PND.  Current Medications: Current Meds  Medication Sig   amLODipine  (NORVASC ) 5 MG tablet Take 5 mg by mouth daily.   aspirin EC 81 MG tablet Take 81 mg by mouth daily.   atorvastatin  (LIPITOR) 80 MG tablet Take 80 mg by mouth at bedtime.   celecoxib (CELEBREX) 100 MG capsule Take 100 mg by mouth 2 (two)  times daily as needed.   losartan  (COZAAR ) 25 MG tablet Take 25 mg by mouth daily.   metFORMIN (GLUCOPHAGE-XR) 500 MG 24 hr tablet Take 500 mg by mouth daily.   metoprolol  succinate (TOPROL -XL) 100 MG 24 hr tablet Take 100 mg by mouth at bedtime. Per patient taking 100 mg, sometimes 150 mg for elevated blood pressures.   Omega-3 Fatty Acids (OMEGA-3 FISH OIL) 1200 MG CAPS Take 1 capsule by mouth daily.   sildenafil (REVATIO) 20 MG tablet as needed.   tamsulosin (FLOMAX) 0.4 MG CAPS capsule Take 0.4 mg by mouth daily.   Vitamin D, Ergocalciferol, (DRISDOL) 50000 UNITS CAPS Take 50,000 Units by mouth every Sunday.   zolpidem (AMBIEN) 10 MG tablet Take 10 mg by mouth at bedtime as needed.   [DISCONTINUED] amLODipine  (NORVASC ) 5 MG tablet Take 5 mg by mouth at bedtime. Per patient taking 7.5 mg daily     Allergies:   Ace inhibitors and Ramipril   ROS:   Please see the history of present illness.    All other systems reviewed and are negative.  EKGs/Labs/Other Studies Reviewed:    The following studies were reviewed today: Cardiac Studies & Procedures     STRESS TESTS  ECHOCARDIOGRAM STRESS TEST 12/24/2021  Narrative EXERCISE STRESS ECHO REPORT   --------------------------------------------------------------------------------  Patient Name:   DR. [name] Date of Exam: 12/24/2021 Medical Rec #:  [id number]         Height:  69.5 in Accession #:    [id number]        Weight:       175.4 lb Date of Birth:  31-Jul-1940         BSA:          1.964 m Patient Age:    81 years          BP:           167/84 mmHg Patient Gender: M                 HR:           75 bpm. Exam Location:  [facility]  Procedure: Limited Echo, Stress Echo, Limited Color Doppler and Cardiac Doppler  Indications:    I42.2 Hypertrophic cardiomyopathy  History:        Patient has prior history of Echocardiogram examinations, most recent 06/20/2020.  Sonographer:    Jacob Hess RCS Referring Phys:  [phone] Jacob Hess  IMPRESSIONS   1. This is an inconclusive stress echocardiogram for ischemia. 2. Difficult study. Increase in mitral valve velocities with peak exercise. Difficult LVOT imaging. 3. Post exercise LVOT gradient 23 mm Hg. Not suggestive of a significant inducible gradient. 4. Similar to prior Stress Echo. 5. This is a low risk study.  FINDINGS  Exam Protocol: The patient exercised on a treadmill according to a Bruce protocol.   Patient Performance: The patient exercised for 9 minutes and 0 seconds, achieving 10.10 METS. The maximum stage achieved was IV of the Bruce protocol. The baseline heart rate was 75 bpm. The heart rate at peak stress was 148 bpm. The target heart rate was calculated to be 120 bpm. The percentage of maximum predicted heart rate achieved was 105.1 %. The baseline blood pressure was 167/84 mmHg. The blood pressure at peak stress was 158/59 mmHg. The blood pressure response was normal. The patient developed Dyspnea during the stress exam. The symptoms resolved with rest. The patient's functional capacity was average.  EKG: Resting EKG showed normal sinus rhythm. The patient had LVH and no abnormal EKG findings during exercise. No PVCs during study.   2D Echo Findings: The baseline ejection fraction was 70%. The peak ejection fraction at stress was 80%. Baseline regional wall motion abnormalities were not present. This is an inconclusive stress echocardiogram for ischemia.   Jacob Leavens MD Electronically signed on 12/24/2021 at 4:13:40 PM     Final  ECHOCARDIOGRAM  ECHOCARDIOGRAM COMPLETE 12/24/2021  Narrative ECHOCARDIOGRAM REPORT    Patient Name:   DR. [name] Date of Exam: 12/24/2021 Medical Rec #:  [id number]         Height:       69.5 in Accession #:    [id number]        Weight:       175.4 lb Date of Birth:  1940/04/19         BSA:          1.964 m Patient Age:    81 years          BP:           147/79 mmHg Patient  Gender: M                 HR:           63 bpm. Exam Location:  [facility]  Procedure: 2D Echo, Color Doppler and Cardiac Doppler  Indications:    I42.2 Hypertrophic cardiomyopathy  History:  Patient has prior history of Echocardiogram examinations, most recent 06/20/2020. Risk Factors:Hypertension and HLD.  Sonographer:    Jacob Hess RCS Referring Phys: [phone] Tareka Jhaveri  IMPRESSIONS   1. Left ventricular ejection fraction, by estimation, is 70 to 75%. The left ventricle has hyperdynamic function. The left ventricle has no regional wall motion abnormalities. There is severe asymmetric left ventricular hypertrophy of the basal-septal segment. Left ventricular diastolic parameters are consistent with Grade I diastolic dysfunction (impaired relaxation). There is systolic anterior motion of the mitral valve leaflet with minimal LVOT gradient (7.5 mm Hg) at rest and with increase 18 mm Hg with Valsalva. 2. Right ventricular systolic function is hyperdynamic. The right ventricular size is normal. There is normal pulmonary artery systolic pressure. 3. The mitral valve is normal in structure. Mild to moderate mitral valve regurgitation. No evidence of mitral stenosis. 4. The aortic valve was not well visualized. Aortic valve regurgitation is not visualized. No aortic stenosis is present.  Comparison(s): Prior images reviewed side by side. No significant LVOT gradient ( < 30 mm Hg) at rest. A Stress echo for gradient induction is being performed.  FINDINGS Left Ventricle: Left ventricular ejection fraction, by estimation, is 70 to 75%. The left ventricle has hyperdynamic function. The left ventricle has no regional wall motion abnormalities. The left ventricular internal cavity size was normal in size. There is severe asymmetric left ventricular hypertrophy of the basal-septal segment. Left ventricular diastolic parameters are consistent with Grade I diastolic dysfunction (impaired  relaxation).  Right Ventricle: The right ventricular size is normal. No increase in right ventricular wall thickness. Right ventricular systolic function is hyperdynamic. There is normal pulmonary artery systolic pressure. The tricuspid regurgitant velocity is 1.24 m/s, and with an assumed right atrial pressure of 3 mmHg, the estimated right ventricular systolic pressure is 9.2 mmHg.  Left Atrium: Left atrial size was normal in size.  Right Atrium: Right atrial size was normal in size.  Pericardium: There is no evidence of pericardial effusion.  Mitral Valve: The mitral valve is normal in structure. Mild to moderate mitral valve regurgitation. No evidence of mitral valve stenosis.  Tricuspid Valve: The tricuspid valve is not well visualized. Tricuspid valve regurgitation is not demonstrated. No evidence of tricuspid stenosis.  Aortic Valve: The aortic valve was not well visualized. Aortic valve regurgitation is not visualized. No aortic stenosis is present.  Pulmonic Valve: The pulmonic valve was normal in structure. Pulmonic valve regurgitation is not visualized. No evidence of pulmonic stenosis.  Aorta: The aortic root and ascending aorta are structurally normal, with no evidence of dilitation.  IAS/Shunts: No atrial level shunt detected by color flow Doppler.   LEFT VENTRICLE PLAX 2D LVIDd:         4.10 cm   Diastology LVIDs:         2.20 cm   LV e' medial:    7.94 cm/s LV PW:         0.70 cm   LV E/e' medial:  12.0 LV IVS:        1.20 cm   LV e' lateral:   12.60 cm/s LVOT diam:     1.70 cm   LV E/e' lateral: 7.5 LV SV:         69 LV SV Index:   35 LVOT Area:     2.27 cm   RIGHT VENTRICLE RV Basal diam:  2.60 cm RV S prime:     15.30 cm/s TAPSE (M-mode): 2.5 cm RVSP:  9.2 mmHg  LEFT ATRIUM             Index        RIGHT ATRIUM           Index LA diam:        3.50 cm 1.78 cm/m   RA Pressure: 3.00 mmHg LA Vol (A2C):   33.4 ml 17.01 ml/m  RA Area:     12.00  cm LA Vol (A4C):   39.6 ml 20.17 ml/m  RA Volume:   24.50 ml  12.48 ml/m LA Biplane Vol: 37.7 ml 19.20 ml/m AORTIC VALVE LVOT Vmax:   134.00 cm/s LVOT Vmean:  96.300 cm/s LVOT VTI:    0.304 m  AORTA Ao Root diam: 3.30 cm Ao Asc diam:  3.30 cm  MITRAL VALVE               TRICUSPID VALVE MV Area (PHT):             TR Peak grad:   6.2 mmHg MV Decel Time:             TR Vmax:        124.00 cm/s MV E velocity: 94.90 cm/s  Estimated RAP:  3.00 mmHg MV A velocity: 67.60 cm/s  RVSP:           9.2 mmHg MV E/A ratio:  1.40 SHUNTS Systemic VTI:  0.30 m Systemic Diam: 1.70 cm  Jacob Leavens MD Electronically signed by Jacob Leavens MD Signature Date/Time: 12/24/2021/3:36:59 PM    Final   MONITORS  LONG TERM MONITOR (3-14 DAYS) 04/15/2022  Narrative Patch Wear Time:  13 days and 23 hours (2024-01-22T17:01:26-0500 to 2024-02-05T17:01:22-0500)  Patient had a min HR of 50 bpm, max HR of 160 bpm, and avg HR of 66 bpm. Predominant underlying rhythm was Sinus Rhythm. 1 run of Ventricular Tachycardia occurred lasting 4 beats with a max rate of 135 bpm (avg 129 bpm). 5 Supraventricular Tachycardia runs occurred, the run with the fastest interval lasting 9 beats with a max rate of 160 bpm (avg 150 bpm); the run with the fastest interval was also the longest. Isolated SVEs were rare (<1.0%), SVE Couplets were rare (<1.0%), and SVE Triplets were rare (<1.0%). Isolated VEs were rare (<1.0%, 236), VE Couplets were rare (<1.0%, 1), and VE Triplets were rare (<1.0%, 2).  SUMMARY: the basic rhythms is normal sinus with an average HR of 66 bpm. There is no atrial fibrillation or flutter. There are short supraventricular runs, with the longest lasting 9 beats. There is a short ventricular run of 4 beats. No high-grade AV block or pathologic pauses > 3 seconds. Overall PVC and PAC burden low at <1%.  CT SCANS  CT CORONARY MORPH W/CTA COR W/SCORE 04/14/2013  Addendum 04/14/2013  5:40  PM ADDENDUM REPORT: 04/14/2013 17:37  CLINICAL DATA:  Chest pain  EXAM: Cardiac CTA  MEDICATIONS: Sub lingual nitro. 4mg  and lopressor  5mg  IV  TECHNIQUE: The patient was scanned on a Philips 256 slice scanner. Gantry rotation speed was 270 msecs. Collimation was .9mm. A 100 kV prospective scan was triggered in the descending thoracic aorta at 111 HU's with 5% padding centered around 78% of the R-R interval. Average HR during the scan was 60 bpm. The 3D data set was interpreted on a dedicated work station using MPR, MIP and VRT modes. A total of 80cc of contrast was used.  FINDINGS: Non-cardiac: See separate report from [facility]. No significant findings on limited lung and  soft tissue windows.  Calcium  Score:  322 Agatston units  Coronary Arteries: Right dominant with no anomalies  LM:  No plaque or stenosis.  LAD system: Calcified plaque noted in the proximal LAD with mild stenosis. There was a small D1 and a moderate D2 without significant disease.  Circumflex system: Calcified plaque in the proximal LCx with minimal stenosis. There was a small ramus, a small OM1, and a moderate PLOM without significant disease.  RCA: There was calcified plaque in the proximal and in the mid RCA with minimal stenosis.  IMPRESSION: 1.  Nonobstructive coronary disease as described above.  2. Coronary artery calcium  score 322 Agatston units, placing the patient in the 61st percentile for his age and gender. This suggests intermediate risk of future cardiac events.  Dalton Mclean   Electronically Signed By: Ezra Shuck M.D. On: 04/14/2013 17:37  Narrative EXAM: OVER-READ INTERPRETATION  CT CHEST  The following report is an over-read performed by radiologist Dr. Toribio Cove [facility] on 04/14/2013. This over-read does not include interpretation of cardiac or coronary anatomy or pathology. The interpretation by the cardiologist  is attached.  COMPARISON:  No priors.  FINDINGS: Within the visualized portions of the thorax there is no acute consolidative airspace disease, no pleural effusions and no suspicious appearing pulmonary nodules or masses. Visualized portions of the upper abdomen are unremarkable. There are no aggressive appearing lytic or blastic lesions noted in the visualized portions of the skeleton.  IMPRESSION: 1. No significant incidental noncardiac findings noted.  Electronically Signed: By: Toribio Aye M.D. On: 04/14/2013 15:21  CARDIAC MRI  MR CARDIAC MORPHOLOGY W WO CONTRAST 05/04/2013  Narrative CLINICAL DATA:  Possible hypertrophic cardiomyopathy  EXAM: CARDIAC MRI  TECHNIQUE: The patient was scanned on a 1.5 Tesla GE magnet. A dedicated cardiac coil was used. Functional imaging was done using Fiesta sequences. 2,3, and 4 chamber views were done to assess for RWMA's. Modified Simpson's rule using a short axis stack was used to calculate an ejection fraction on a dedicated work [employer]. The patient received 27 cc of Multihance  and rest perfusion sequences were done. After 10 minutes inversion recovery sequences were used to assess for infiltration and scar tissue.  CONTRAST:  27 cc Multihance   MEASUREMENTS: MEASUREMENTS Septal wall:  15 mm  Posterior wall:  7 mm  LV EDV:  99 mL  LV SV 69 mL  LV EF 70%  FINDINGS: Limited views of the lung fields showed no gross abnormalities. Normal left ventricular size with vigorous systolic function, EF 70%. No wall motion abnormalities. There was mild asymmetric basal septal hypertrophy. There was mitral valve systolic anterior motion with turbulent flow across the LV outflow tract. Normal right ventricular size and systolic function. Normal atrial sizes. The aortic valve was trileaflet with no stenosis or regurgitation. As noted, there was systolic anterior motion of the mitral valve with no more  than mild mitral regurgitation (though flow sequences to quantify were not done).  First pass perfusion images appeared normal. There was no myocardial delayed enhancement.  IMPRESSION: 1.  Normal LV size with vigorous systolic function, EF 70%.  2. Mild asymmetric basal septal hypertrophy with turbulence across the LV outflow tract suggesting elevated gradient (echo needed to quantify gradient). There was mild valve systolic anterior motion with no more than mild MR.  3.  Normal RV size and systolic function.  4. No myocardial delayed enhancement, so no definitive evidence for prior myocardial infarction, infiltrative disease, or myocarditis.  5. 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 There is no delayed enhancement. It is possible that he could have HOCM physiology in the setting of significant hypertension with basal septal hypertrophy (in the absence of genetically mediated HOCM).  Dalton Mclean   Electronically Signed By: Ezra Shuck M.D. On: 05/04/2013 11:54         EKG:   EKG Interpretation Date/Time:  Monday March 15 2023 11:12:21 EST Ventricular Rate:  59 PR Interval:  162 QRS Duration:  86 QT Interval:  412 QTC Calculation: 407 R Axis:   61  Text Interpretation: Sinus bradycardia When compared with ECG of 12-Aug-2011 13:42, Vent. rate has decreased BY  39 BPM ST no longer depressed in Inferior leads T wave inversion now evident in Inferior leads no other significant changes Confirmed by Wonda Sharper [phone]) on 03/15/2023 11:29:25 AM    Recent Labs: No results found for requested labs within last 365 days.  Recent Lipid Panel No results found for: CHOL, TRIG, HDL, CHOLHDL, VLDL, LDLCALC, LDLDIRECT   Risk Assessment/Calculations:                Physical Exam:    VS:  BP 136/80   Pulse (!) 59   Ht 5' 9 (1.753 m)   Wt 165 lb 3.2 oz (74.9 kg)   SpO2 99%   BMI 24.40 kg/m     Wt Readings  from Last 3 Encounters:  03/15/23 165 lb 3.2 oz (74.9 kg)  10/01/21 175 lb 6.4 oz (79.6 kg)  02/11/21 170 lb (77.1 kg)     GEN:  Well nourished, well developed in no acute distress HEENT: Normal NECK: No JVD; No carotid bruits LYMPHATICS: No lymphadenopathy CARDIAC: RRR, soft systolic murmur at the left lower sternal border RESPIRATORY:  Clear to auscultation without rales, wheezing or rhonchi  ABDOMEN: Soft, non-tender, non-distended MUSCULOSKELETAL:  No edema; No deformity  SKIN: Warm and dry NEUROLOGIC:  Alert and oriented x 3 PSYCHIATRIC:  Normal affect   Assessment & Plan Primary hypertension Blood pressure well-controlled on a combination of amlodipine  5 mg, losartan  25 mg, and metoprolol  succinate 100 to 150 mg daily.  Continue current management.  Recent labs reviewed. Atherosclerosis of native coronary artery of native heart, unspecified whether angina present The patient has nonobstructive CAD.  He is experiencing no anginal symptoms. Hypertrophic cardiomyopathy (HCC) Negative genetic testing in the past.  Basal septal hypertrophy with mitral valve SAM noted.  No recent symptoms.  Continue current management.  Repeat echocardiogram next year prior to his office visit. Mixed hyperlipidemia Lipids under ideal control on atorvastatin  80 mg daily.  Last lipids with a cholesterol 110, LDL 48, HDL 48.      Medication Adjustments/Labs and Tests Ordered: Current medicines are reviewed at length with the patient today.  Concerns regarding medicines are outlined above.  Orders Placed This Encounter  Procedures   EKG 12-Lead   ECHOCARDIOGRAM COMPLETE   No orders of the defined types were placed in this encounter.   Patient Instructions  Testing/Procedures: ECHO (prior to next year's visit) Your physician has requested that you have an echocardiogram. Echocardiography is a painless test that uses sound waves to create images of your heart. It provides your doctor with  information about the size and shape of your heart and how well your heart's chambers and valves are working. This procedure takes approximately one hour. There are no restrictions for this procedure. Please do NOT wear cologne, perfume, aftershave, or lotions (deodorant is  allowed). Please arrive 15 minutes prior to your appointment time.  Please note: We ask at that you not bring children with you during ultrasound (echo/ vascular) testing. Due to room size and safety concerns, children are not allowed in the ultrasound rooms during exams. Our front office staff cannot provide observation of children in our lobby area while testing is being conducted. An adult accompanying a patient to their appointment will only be allowed in the ultrasound room at the discretion of the ultrasound technician under special circumstances. We apologize for any inconvenience.  Follow-Up: At [facility], you and your health needs are our priority.  As part of our continuing mission to provide you with exceptional heart care, we have created designated Provider Care Teams.  These Care Teams include your primary Cardiologist (physician) and Advanced Practice Providers (APPs -  Physician Assistants and Nurse Practitioners) who all work together to provide you with the care you need, when you need it.  Your next appointment:   1 year(s)  Provider:   Ozell Fell, MD             Signed, Jacob Fell, MD  03/15/2023 3:36 PM    [facility]

## 2023-03-15 NOTE — Assessment & Plan Note (Signed)
 Blood pressure well-controlled on a combination of amlodipine 5 mg, losartan 25 mg, and metoprolol succinate 100 to 150 mg daily.  Continue current management.  Recent labs reviewed.

## 2023-03-15 NOTE — Patient Instructions (Signed)
 Testing/Procedures: ECHO (prior to next year's visit) Your physician has requested that you have an echocardiogram. Echocardiography is a painless test that uses sound waves to create images of your heart. It provides your doctor with information about the size and shape of your heart and how well your heart's chambers and valves are working. This procedure takes approximately one hour. There are no restrictions for this procedure. Please do NOT wear cologne, perfume, aftershave, or lotions (deodorant is allowed). Please arrive 15 minutes prior to your appointment time.  Please note: We ask at that you not bring children with you during ultrasound (echo/ vascular) testing. Due to room size and safety concerns, children are not allowed in the ultrasound rooms during exams. Our front office staff cannot provide observation of children in our lobby area while testing is being conducted. An adult accompanying a patient to their appointment will only be allowed in the ultrasound room at the discretion of the ultrasound technician under special circumstances. We apologize for any inconvenience.  Follow-Up: At [facility], you and your health needs are our priority.  As part of our continuing mission to provide you with exceptional heart care, we have created designated Provider Care Teams.  These Care Teams include your primary Cardiologist (physician) and Advanced Practice Providers (APPs -  Physician Assistants and Nurse Practitioners) who all work together to provide you with the care you need, when you need it.  Your next appointment:   1 year(s)  Provider:   Tonny Bollman, MD

## 2024-02-04 ENCOUNTER — Ambulatory Visit (HOSPITAL_COMMUNITY): Payer: Medicare Other

## 2024-02-14 ENCOUNTER — Other Ambulatory Visit: Payer: Self-pay

## 2024-02-14 DIAGNOSIS — I422 Other hypertrophic cardiomyopathy: Secondary | ICD-10-CM

## 2024-03-09 ENCOUNTER — Encounter: Payer: Self-pay | Admitting: Cardiovascular Disease

## 2024-03-17 ENCOUNTER — Ambulatory Visit (HOSPITAL_COMMUNITY)
Admission: RE | Admit: 2024-03-17 | Discharge: 2024-03-17 | Disposition: A | Discharge status: Home / Self Care | Source: Ambulatory Visit | Attending: Cardiology | Admitting: Cardiology

## 2024-03-17 DIAGNOSIS — I422 Other hypertrophic cardiomyopathy: Secondary | ICD-10-CM | POA: Diagnosis present

## 2024-03-17 LAB — ECHOCARDIOGRAM COMPLETE
Area-P 1/2: 3.45 cm2
S' Lateral: 2.8 cm

## 2024-03-19 ENCOUNTER — Ambulatory Visit: Payer: Self-pay | Admitting: Cardiovascular Disease

## 2024-08-24 ENCOUNTER — Ambulatory Visit: Admitting: Cardiovascular Disease
